# Patient Record
Sex: Female | Born: 1990 | Race: Black or African American | Hispanic: No | Marital: Single | Smoking: Former smoker
Health system: Southern US, Community
[De-identification: ages and names within clinical notes are randomized; demographics above are authoritative.]

## PROBLEM LIST (undated history)

## (undated) ENCOUNTER — Inpatient Hospital Stay (HOSPITAL_COMMUNITY): Payer: Self-pay

## (undated) DIAGNOSIS — J40 Bronchitis, not specified as acute or chronic: Secondary | ICD-10-CM

## (undated) DIAGNOSIS — O139 Gestational [pregnancy-induced] hypertension without significant proteinuria, unspecified trimester: Secondary | ICD-10-CM

## (undated) DIAGNOSIS — R519 Headache, unspecified: Secondary | ICD-10-CM

## (undated) DIAGNOSIS — Q0700 Arnold-Chiari syndrome without spina bifida or hydrocephalus: Secondary | ICD-10-CM

## (undated) DIAGNOSIS — J45909 Unspecified asthma, uncomplicated: Secondary | ICD-10-CM

## (undated) DIAGNOSIS — R51 Headache: Secondary | ICD-10-CM

## (undated) HISTORY — PX: NOSE SURGERY: SHX723

## (undated) HISTORY — DX: Arnold-Chiari syndrome without spina bifida or hydrocephalus: Q07.00

---

## 2012-01-21 ENCOUNTER — Emergency Department (HOSPITAL_COMMUNITY)
Admission: EM | Admit: 2012-01-21 | Discharge: 2012-01-21 | Disposition: A | Discharge status: Home / Self Care | Payer: Medicaid - Out of State | Attending: Emergency Medicine | Admitting: Emergency Medicine

## 2012-01-21 ENCOUNTER — Emergency Department (HOSPITAL_COMMUNITY): Payer: Medicaid - Out of State

## 2012-01-21 ENCOUNTER — Encounter (HOSPITAL_COMMUNITY): Payer: Self-pay | Admitting: *Deleted

## 2012-01-21 DIAGNOSIS — J45901 Unspecified asthma with (acute) exacerbation: Secondary | ICD-10-CM | POA: Insufficient documentation

## 2012-01-21 DIAGNOSIS — R059 Cough, unspecified: Secondary | ICD-10-CM | POA: Insufficient documentation

## 2012-01-21 DIAGNOSIS — R05 Cough: Secondary | ICD-10-CM | POA: Insufficient documentation

## 2012-01-21 DIAGNOSIS — O9989 Other specified diseases and conditions complicating pregnancy, childbirth and the puerperium: Secondary | ICD-10-CM | POA: Insufficient documentation

## 2012-01-21 DIAGNOSIS — J3489 Other specified disorders of nose and nasal sinuses: Secondary | ICD-10-CM | POA: Insufficient documentation

## 2012-01-21 DIAGNOSIS — Z79899 Other long term (current) drug therapy: Secondary | ICD-10-CM | POA: Insufficient documentation

## 2012-01-21 HISTORY — DX: Bronchitis, not specified as acute or chronic: J40

## 2012-01-21 HISTORY — DX: Unspecified asthma, uncomplicated: J45.909

## 2012-01-21 MED ORDER — ALBUTEROL SULFATE (5 MG/ML) 0.5% IN NEBU
5.0000 mg | INHALATION_SOLUTION | Freq: Once | RESPIRATORY_TRACT | Status: AC
  Administered 2012-01-21: 5 mg via RESPIRATORY_TRACT
  Filled 2012-01-21: qty 1

## 2012-01-21 MED ORDER — IPRATROPIUM BROMIDE 0.02 % IN SOLN
0.5000 mg | Freq: Once | RESPIRATORY_TRACT | Status: AC
  Administered 2012-01-21: 0.5 mg via RESPIRATORY_TRACT
  Filled 2012-01-21: qty 2.5

## 2012-01-21 MED ORDER — ALBUTEROL SULFATE HFA 108 (90 BASE) MCG/ACT IN AERS: 1.0000 | INHALATION_SPRAY | Freq: Four times a day (QID) | RESPIRATORY_TRACT | Status: DC | PRN

## 2012-01-21 MED ORDER — PREDNISONE 20 MG PO TABS
60.0000 mg | ORAL_TABLET | Freq: Once | ORAL | Status: AC
  Administered 2012-01-21: 60 mg via ORAL
  Filled 2012-01-21: qty 3

## 2012-01-21 MED ORDER — ALBUTEROL (5 MG/ML) CONTINUOUS INHALATION SOLN
10.0000 mg/h | INHALATION_SOLUTION | Freq: Once | RESPIRATORY_TRACT | Status: AC
  Administered 2012-01-21: 10 mg/h via RESPIRATORY_TRACT
  Filled 2012-01-21 (×2): qty 20

## 2012-01-21 MED ORDER — PREDNISONE 50 MG PO TABS: 50.0000 mg | ORAL_TABLET | Freq: Every day | ORAL | Status: DC

## 2012-01-21 NOTE — ED Provider Notes (Signed)
History     CSN: [id number]  Arrival date & time 01/21/12  4098   First MD Initiated Contact with Patient 01/21/12 [phone]      Chief Complaint  Patient presents with  . Shortness of Breath  . pregnant     (Consider location/radiation/quality/duration/timing/severity/associated sxs/prior treatment) HPI Pt with history of childhood asthma and exacerbation several weeks ago but did not get inh rx filled, presents with wheezing, SOB and productive cough starting yesterday and progressing through the night. No fever, chills. No Cp, abd pain, N/V/D, vag bleeding or d/c. Pt is [redacted] wk pregnant.  Past Medical History  Diagnosis Date  . Asthma   . Bronchitis     Past Surgical History  Procedure Date  . Nose surgery   . Cesarean section     History reviewed. No pertinent family history.  History  Substance Use Topics  . Smoking status: Never Smoker   . Smokeless tobacco: Not on file  . Alcohol Use: No    OB History    Grav Para Term Preterm Abortions TAB SAB Ect Mult Living   1               Review of Systems  Constitutional: Negative for fever and chills.  HENT: Positive for congestion. Negative for sore throat and sinus pressure.   Respiratory: Positive for cough, shortness of breath and wheezing. Negative for chest tightness.   Cardiovascular: Negative for chest pain, palpitations and leg swelling.  Gastrointestinal: Negative for nausea, vomiting, abdominal pain and constipation.  Genitourinary: Negative for dysuria, vaginal bleeding, vaginal discharge and pelvic pain.  Musculoskeletal: Negative for myalgias and back pain.  Skin: Negative for rash and wound.  Neurological: Negative for dizziness, weakness, light-headedness, numbness and headaches.  All other systems reviewed and are negative.    Allergies  Penicillins  Home Medications   Current Outpatient Rx  Name  Route  Sig  Dispense  Refill  . ALBUTEROL SULFATE HFA 108 (90 BASE) MCG/ACT IN AERS  Inhalation   Inhale 1-2 puffs into the lungs every 6 (six) hours as needed for wheezing.   1 Inhaler   0   . PREDNISONE 50 MG PO TABS   Oral   Take 1 tablet (50 mg total) by mouth daily.   5 tablet   0     BP 129/66  Pulse 102  Temp 98 F (36.7 C) (Oral)  Resp 20  SpO2 100%  Physical Exam  Nursing note and vitals reviewed. Constitutional: She is oriented to person, place, and time. She appears well-developed and well-nourished. No distress.  HENT:  Head: Normocephalic and atraumatic.  Mouth/Throat: Oropharynx is clear and moist. No oropharyngeal exudate.  Eyes: EOM are normal. Pupils are equal, round, and reactive to light.  Neck: Normal range of motion. Neck supple.  Cardiovascular: Normal rate and regular rhythm.   Pulmonary/Chest: Effort normal. No respiratory distress. She has wheezes (Exp wheezes throughout). She has no rales.  Abdominal: Soft. Bowel sounds are normal. She exhibits no distension and no mass. There is no tenderness. There is no rebound and no guarding.  Musculoskeletal: Normal range of motion. She exhibits no edema and no tenderness.       No calf swelling or tenderness  Neurological: She is alert and oriented to person, place, and time.  Skin: Skin is warm and dry. No rash noted. No erythema.  Psychiatric: She has a normal mood and affect. Her behavior is normal.    ED Course  Procedures (  including critical care time)  Labs Reviewed - No data to display Dg Chest 1 View  01/21/2012  *RADIOLOGY REPORT*  Clinical Data: Shortness of breath; history of asthma.  CHEST - 1 VIEW  Comparison: None.  Findings: The lungs are well-aerated and clear.  There is no evidence of focal opacification, pleural effusion or pneumothorax.  The cardiomediastinal silhouette is within normal limits.  No acute osseous abnormalities are seen.  IMPRESSION: No acute cardiopulmonary process seen.   Original Report Authenticated By: Tonia Ghent, M.D.      1. Asthma  exacerbation       MDM          Loren Racer, MD 01/22/12 1228

## 2012-01-21 NOTE — ED Notes (Signed)
Pt states [redacted] weeks pregnant and feels like she cannot breath. Pt ambulatory and denies pain. Pt states it started tonight and she tired hot shower and it didn't help. Pt not in respiratory distress.

## 2012-01-21 NOTE — ED Provider Notes (Signed)
Care assumed at sign out. Patient hx of asthma, [redacted] wk pregnant, here with wheezing. CXR nl, after nebs and steroids, felt better. Minimal wheezing now, pulse ox 100% on RA. Will d/c home on a course of prednisone, and albuterol prn.   Richardean Canal, MD 01/21/12 [phone]

## 2013-11-28 ENCOUNTER — Encounter (HOSPITAL_COMMUNITY): Payer: Self-pay | Admitting: *Deleted

## 2015-02-14 ENCOUNTER — Encounter (HOSPITAL_COMMUNITY): Payer: Self-pay | Admitting: Emergency Medicine

## 2015-02-14 ENCOUNTER — Emergency Department (HOSPITAL_COMMUNITY)
Admission: EM | Admit: 2015-02-14 | Discharge: 2015-02-14 | Disposition: A | Discharge status: Home / Self Care | Payer: Medicaid Other | Attending: Emergency Medicine | Admitting: Emergency Medicine

## 2015-02-14 DIAGNOSIS — H6092 Unspecified otitis externa, left ear: Secondary | ICD-10-CM | POA: Diagnosis not present

## 2015-02-14 DIAGNOSIS — J45909 Unspecified asthma, uncomplicated: Secondary | ICD-10-CM | POA: Insufficient documentation

## 2015-02-14 DIAGNOSIS — Z79899 Other long term (current) drug therapy: Secondary | ICD-10-CM | POA: Diagnosis not present

## 2015-02-14 DIAGNOSIS — Z880 Allergy status to penicillin: Secondary | ICD-10-CM | POA: Diagnosis not present

## 2015-02-14 DIAGNOSIS — H9202 Otalgia, left ear: Secondary | ICD-10-CM | POA: Diagnosis present

## 2015-02-14 DIAGNOSIS — Z7952 Long term (current) use of systemic steroids: Secondary | ICD-10-CM | POA: Diagnosis not present

## 2015-02-14 DIAGNOSIS — H9190 Unspecified hearing loss, unspecified ear: Secondary | ICD-10-CM | POA: Diagnosis not present

## 2015-02-14 MED ORDER — CEFDINIR 300 MG PO CAPS: 300.0000 mg | ORAL_CAPSULE | Freq: Two times a day (BID) | ORAL | Status: DC

## 2015-02-14 MED ORDER — CIPROFLOXACIN-DEXAMETHASONE 0.3-0.1 % OT SUSP: 4.0000 [drp] | Freq: Two times a day (BID) | OTIC | Status: DC

## 2015-02-14 NOTE — Discharge Instructions (Signed)
Use ear drops and take antibiotic as directed for your ear infection. Avoid being underwater until this heals, do not submerge your head in pools or bath tubs, etc. Use tylenol or motrin as needed for pain. Use warm compresses as needed for additional relief. Follow up with the ENT doctor listed above for ongoing management of your ear infection in 1 week. Return to the ER for changes or worsening symptoms.   Otitis Externa Otitis externa is a germ infection in the outer ear. The outer ear is the area from the eardrum to the outside of the ear. Otitis externa is sometimes called "swimmer's ear." HOME CARE  Put drops in the ear as told by your doctor.  Only take medicine as told by your doctor.  If you have diabetes, your doctor may give you more directions. Follow your doctor's directions.  Keep all doctor visits as told. To avoid another infection:  Keep your ear dry. Use the corner of a towel to dry your ear after swimming or bathing.  Avoid scratching or putting things inside your ear.  Avoid swimming in lakes, dirty water, or pools that use a chemical called chlorine poorly.  You may use ear drops after swimming. Combine equal amounts of white vinegar and alcohol in a bottle. Put 3 or 4 drops in each ear. GET HELP IF:   You have a fever.  Your ear is still red, puffy (swollen), or painful after 3 days.  You still have yellowish-white fluid (pus) coming from the ear after 3 days.  Your redness, puffiness, or pain gets worse.  You have a really bad headache.  You have redness, puffiness, pain, or tenderness behind your ear. MAKE SURE YOU:   Understand these instructions.  Will watch your condition.  Will get help right away if you are not doing well or get worse.   This information is not intended to replace advice given to you by your health care provider. Make sure you discuss any questions you have with your health care provider.   Document Released: 07/02/2007  Document Revised: 02/03/2014 Document Reviewed: 01/30/2011 Elsevier Interactive Patient Education 2016 [employer]  Ear Drops, Adult You need to put eardrops in your ear. HOME CARE   Put drops in your affected ear as told.  After putting in the drops, lie down with the ear you put the drops in facing up. Stay this way for 10 minutes. Use the ear drops as long as your doctor tells you.  Before you get up, put a cotton ball gently in your ear. Do not push it far in your ear.  Do not wash out your ears unless your doctor says it is okay.  Finish all medicines as told by your doctor. You may be told to keep using the eardrops even if you start to feel better.  See your doctor as told for follow-up visits. GET HELP IF:  You have pain that gets worse.  Any unusual fluid (drainage) is coming from your ear (especially if the fluid stinks).  You have trouble hearing.  You get really dizzy as if the room is spinning and feel sick to your stomach (vertigo).  The outside of your ear becomes red or puffy or both. This may be a sign of an allergic reaction. MAKE SURE YOU:   Understand these instructions.  Will watch your condition.  Will get help right away if you are not doing well or get worse.   This information is not intended  to replace advice given to you by your health care provider. Make sure you discuss any questions you have with your health care provider.   Document Released: 07/03/2009 Document Revised: 02/03/2014 Document Reviewed: 08/10/2012 Elsevier Interactive Patient Education 2016 [employer].  Earache An earache, also called otalgia, can be caused by many things. Pain from an earache can be sharp, dull, or burning. The pain may be temporary or constant. Earaches can be caused by problems with the ear, such as infection in either the middle ear or the ear canal, injury, impacted ear wax, middle ear pressure, or a foreign body in the ear. Ear pain can also result  from problems in other areas. This is called referred pain. For example, pain can come from a sore throat, a tooth infection, or problems with the jaw or the joint between the jaw and the skull (temporomandibular joint, or TMJ). The cause of an earache is not always easy to identify. Watchful waiting may be appropriate for some earaches until a clear cause of the pain can be found. HOME CARE INSTRUCTIONS Watch your condition for any changes. The following actions may help to lessen any discomfort that you are feeling:  Take medicines only as directed by your health care provider. This includes ear drops.  Apply ice to your outer ear to help reduce pain.  Put ice in a plastic bag.  Place a towel between your skin and the bag.  Leave the ice on for 20 minutes, 2-3 times per day.  Do not put anything in your ear other than medicine that is prescribed by your health care provider.  Try resting in an upright position instead of lying down. This may help to reduce pressure in the middle ear and relieve pain.  Chew gum if it helps to relieve your ear pain.  Control any allergies that you have.  Keep all follow-up visits as directed by your health care provider. This is important. SEEK MEDICAL CARE IF:  Your pain does not improve within 2 days.  You have a fever.  You have new or worsening symptoms. SEEK IMMEDIATE MEDICAL CARE IF:  You have a severe headache.  You have a stiff neck.  You have difficulty swallowing.  You have redness or swelling behind your ear.  You have drainage from your ear.  You have hearing loss.  You feel dizzy.   This information is not intended to replace advice given to you by your health care provider. Make sure you discuss any questions you have with your health care provider.   Document Released: 08/31/2003 Document Revised: 02/03/2014 Document Reviewed: 08/14/2013 Elsevier Interactive Patient Education [employer]

## 2015-02-14 NOTE — ED Provider Notes (Signed)
CSN: [id number]     Arrival date & time 02/14/15  2041 History  By signing my name below, I, Evon Slack, attest that this documentation has been prepared under the direction and in the presence of Rasaan Brotherton Camprubi-Soms, PA-C. Electronically Signed: Evon Slack, ED Scribe. 02/14/2015. 9:16 PM.      Chief Complaint  Patient presents with  . Otalgia    Patient is a 25 y.o. female presenting with ear pain. The history is provided by the patient. No language interpreter was used.  Otalgia Location:  Left Behind ear:  No abnormality Quality:  Sore and burning Severity:  Moderate Onset quality:  Gradual Duration:  2 months Timing:  Constant Progression:  Unchanged Chronicity:  New Context: not elevation change and no water in ear   Relieved by:  Nothing Worsened by:  Palpation Ineffective treatments: cortisporin ear drops. Associated symptoms: ear discharge and hearing loss   Associated symptoms: no abdominal pain, no cough, no diarrhea, no fever, no rhinorrhea, no sore throat and no vomiting   Risk factors: no recent travel    HPI Comments: Meagan Ross is a 25 y.o. female with a PMHx of asthma, who presents to the Emergency Department complaining of left ear pain onset 2 months prior. She describes the pain as an intermittent soreness that she rates 8/10, stating it's nonradiating, worse with palpation, and unimproved with cortisporin ear drops prescribed by her PCP several weeks ago. Pt reports an intermittent burning-itching sensation as well. She reports associated decreased hearing and white discharge, stating the white discharge improved after using the drops, but the pain persisted. She denies any recent travel. She denies swimming recently. Pt denies any recent sick contacts.  Denies fever, chills, ongoing ear drainage, rhinorrhea, sore throat, eye itching/redness, CP, SOB, cough, abdominal pain, n/v/d/c, dysuria, hematuria, numbness, tingling, or weakness. Has not yet  seen an ENT. Has no other medical conditions, including DM or HIV.   Past Medical History  Diagnosis Date  . Asthma   . Bronchitis    Past Surgical History  Procedure Laterality Date  . Nose surgery    . Cesarean section     No family history on file. Social History  Substance Use Topics  . Smoking status: Never Smoker   . Smokeless tobacco: Not on file  . Alcohol Use: No   OB History    Gravida Para Term Preterm AB TAB SAB Ectopic Multiple Living   1              Review of Systems  Constitutional: Negative for fever and chills.  HENT: Positive for ear discharge, ear pain and hearing loss. Negative for rhinorrhea and sore throat.   Eyes: Negative for redness and itching.  Respiratory: Negative for cough and shortness of breath.   Cardiovascular: Negative for chest pain.  Gastrointestinal: Negative for nausea, vomiting, abdominal pain, diarrhea and constipation.  Genitourinary: Negative for dysuria and hematuria.  Musculoskeletal: Negative for myalgias and arthralgias.  Skin: Negative for color change.  Allergic/Immunologic: Negative for immunocompromised state.  Neurological: Negative for weakness and numbness.   A complete 10 system review of systems was obtained and all systems are negative except as noted in the HPI and PMH.     Allergies  Penicillins  Home Medications   Prior to Admission medications   Medication Sig Start Date End Date Taking? Authorizing Provider  albuterol (PROVENTIL HFA;VENTOLIN HFA) 108 (90 BASE) MCG/ACT inhaler Inhale 1-2 puffs into the lungs every 6 (six) hours as needed  for wheezing. 01/21/12   Loren Racer, MD  cefdinir (OMNICEF) 300 MG capsule Take 1 capsule (300 mg total) by mouth 2 (two) times daily. x10 days 02/14/15   Brexlee Heberlein Camprubi-Soms, PA-C  ciprofloxacin-dexamethasone (CIPRODEX) otic suspension Place 4 drops into the left ear 2 (two) times daily. x7 days 02/14/15   Shlomo Seres Camprubi-Soms, PA-C  predniSONE (DELTASONE) 50 MG  tablet Take 1 tablet (50 mg total) by mouth daily. 01/21/12   Loren Racer, MD   BP 118/54 mmHg  Pulse 71  Temp(Src) 98.1 F (36.7 C) (Oral)  Resp 16  Ht  (1.626 m)  Wt 83.915 kg  BMI 31.74 kg/m2  SpO2 98%   Physical Exam  Constitutional: She is oriented to person, place, and time. Vital signs are normal. She appears well-developed and well-nourished.  Non-toxic appearance. No distress.  Afebrile, nontoxic, NAD  HENT:  Head: Normocephalic and atraumatic.  Right Ear: Hearing, tympanic membrane, external ear and ear canal normal.  Left Ear: There is drainage and swelling. No tenderness. No mastoid tenderness.  Mouth/Throat: Mucous membranes are normal.  Right ear clear. Left ear with mucoid drainage and swelling in the external ear canal which obscures visualization of the TM, no pain with pinna retraction, no mastoid tenderness or erythema. Unable to see TM to evaluate for perforation or other findings.  Nose clear. Oropharynx clear and moist, without uvular swelling or deviation, no trismus or drooling, no tonsillar swelling or erythema, no exudates.   Eyes: Conjunctivae and EOM are normal. Right eye exhibits no discharge. Left eye exhibits no discharge.  Neck: Normal range of motion. Neck supple.  Cardiovascular: Normal rate.   Pulmonary/Chest: Effort normal. No respiratory distress.  Abdominal: Normal appearance. She exhibits no distension.  Musculoskeletal: Normal range of motion.  Neurological: She is alert and oriented to person, place, and time. She has normal strength. No sensory deficit.  Skin: Skin is warm, dry and intact. No rash noted.  Psychiatric: She has a normal mood and affect. Her behavior is normal.  Nursing note and vitals reviewed.   ED Course  Procedures (including critical care time) DIAGNOSTIC STUDIES: COORDINATION OF CARE: 9:17 PM-Discussed treatment plan with pt at bedside and pt agreed to plan.     Labs Review Labs Reviewed - No data to  display  Imaging Review No results found.    EKG Interpretation None      MDM   Final diagnoses:  Otitis externa, left  Otalgia of left ear    25 y.o. female here with 2 months of ongoing L otalgia. Was given cortisporin without significant improvement. Endorses hearing loss. On exam, mucoid drainage and swelling in canal, obscuring visualization of TM. Unable to see if TM is perforated or not. Will use ciprodex to treat for likely otitis externa, but also treat for possible otitis media using cefdinir since I can't visualize the TM and can't determine if AOM is also present. Plus, if TM is ruptured, this will adequately cover for this condition. F/up with ENT in 1wk for ongoing management, since this seems to be a persistent issue. Tylenol/motrin for pain. I explained the diagnosis and have given explicit precautions to return to the ER including for any other new or worsening symptoms. The patient understands and accepts the medical plan as it's been dictated and I have answered their questions. Discharge instructions concerning home care and prescriptions have been given. The patient is STABLE and is discharged to home in good condition.   I personally performed the  services described in this documentation, which was scribed in my presence. The recorded information has been reviewed and is accurate.  BP 118/54 mmHg  Pulse 71  Temp(Src) 98.1 F (36.7 C) (Oral)  Resp 16  Ht  (1.626 m)  Wt 83.915 kg  BMI 31.74 kg/m2  SpO2 98%  Meds ordered this encounter  Medications  . ciprofloxacin-dexamethasone (CIPRODEX) otic suspension    Sig: Place 4 drops into the left ear 2 (two) times daily. x7 days    Dispense:  7.5 mL    Refill:  0    Order Specific Question:  Supervising Provider    Answer:  MILLER, BRIAN [[id number]]  . cefdinir (OMNICEF) 300 MG capsule    Sig: Take 1 capsule (300 mg total) by mouth 2 (two) times daily. x10 days    Dispense:  20 capsule    Refill:  0    Order  Specific Question:  Supervising Provider    Answer:  Eber Hong [[id number]]        Ocie Tino Camprubi-Soms, PA-C 02/14/15 2131  Doug Sou, MD 02/14/15 2342

## 2015-02-14 NOTE — ED Notes (Signed)
Pt st's she has had a left earache for approx 2 months

## 2015-02-22 ENCOUNTER — Encounter (HOSPITAL_COMMUNITY): Payer: Self-pay | Admitting: Emergency Medicine

## 2015-02-22 ENCOUNTER — Emergency Department (HOSPITAL_COMMUNITY)
Admission: EM | Admit: 2015-02-22 | Discharge: 2015-02-22 | Disposition: A | Discharge status: Home / Self Care | Payer: Medicaid Other | Attending: Emergency Medicine | Admitting: Emergency Medicine

## 2015-02-22 DIAGNOSIS — S3992XA Unspecified injury of lower back, initial encounter: Secondary | ICD-10-CM | POA: Insufficient documentation

## 2015-02-22 DIAGNOSIS — J45909 Unspecified asthma, uncomplicated: Secondary | ICD-10-CM | POA: Insufficient documentation

## 2015-02-22 DIAGNOSIS — Z880 Allergy status to penicillin: Secondary | ICD-10-CM | POA: Diagnosis not present

## 2015-02-22 DIAGNOSIS — Y998 Other external cause status: Secondary | ICD-10-CM | POA: Diagnosis not present

## 2015-02-22 DIAGNOSIS — Y9389 Activity, other specified: Secondary | ICD-10-CM | POA: Insufficient documentation

## 2015-02-22 DIAGNOSIS — Y92410 Unspecified street and highway as the place of occurrence of the external cause: Secondary | ICD-10-CM | POA: Diagnosis not present

## 2015-02-22 DIAGNOSIS — S3991XA Unspecified injury of abdomen, initial encounter: Secondary | ICD-10-CM | POA: Diagnosis present

## 2015-02-22 DIAGNOSIS — Z7952 Long term (current) use of systemic steroids: Secondary | ICD-10-CM | POA: Insufficient documentation

## 2015-02-22 DIAGNOSIS — Z79899 Other long term (current) drug therapy: Secondary | ICD-10-CM | POA: Diagnosis not present

## 2015-02-22 MED ORDER — METHOCARBAMOL 500 MG PO TABS: 500.0000 mg | ORAL_TABLET | Freq: Two times a day (BID) | ORAL | Status: DC

## 2015-02-22 MED ORDER — IBUPROFEN 800 MG PO TABS: 800.0000 mg | ORAL_TABLET | Freq: Three times a day (TID) | ORAL | Status: DC
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
## 2015-02-22 NOTE — Discharge Instructions (Signed)

## 2015-05-05 ENCOUNTER — Emergency Department (HOSPITAL_COMMUNITY)
Admission: EM | Admit: 2015-05-05 | Discharge: 2015-05-05 | Disposition: A | Discharge status: Home / Self Care | Payer: Medicaid Other | Attending: Emergency Medicine | Admitting: Emergency Medicine

## 2015-05-05 ENCOUNTER — Encounter (HOSPITAL_COMMUNITY): Payer: Self-pay | Admitting: Emergency Medicine

## 2015-05-05 DIAGNOSIS — Z791 Long term (current) use of non-steroidal anti-inflammatories (NSAID): Secondary | ICD-10-CM | POA: Insufficient documentation

## 2015-05-05 DIAGNOSIS — J45909 Unspecified asthma, uncomplicated: Secondary | ICD-10-CM | POA: Diagnosis not present

## 2015-05-05 DIAGNOSIS — Z79899 Other long term (current) drug therapy: Secondary | ICD-10-CM | POA: Insufficient documentation

## 2015-05-05 DIAGNOSIS — R519 Headache, unspecified: Secondary | ICD-10-CM

## 2015-05-05 DIAGNOSIS — H53149 Visual discomfort, unspecified: Secondary | ICD-10-CM | POA: Insufficient documentation

## 2015-05-05 DIAGNOSIS — K0381 Cracked tooth: Secondary | ICD-10-CM | POA: Insufficient documentation

## 2015-05-05 DIAGNOSIS — Z7952 Long term (current) use of systemic steroids: Secondary | ICD-10-CM | POA: Insufficient documentation

## 2015-05-05 DIAGNOSIS — Z880 Allergy status to penicillin: Secondary | ICD-10-CM | POA: Diagnosis not present

## 2015-05-05 DIAGNOSIS — R51 Headache: Secondary | ICD-10-CM | POA: Insufficient documentation

## 2015-05-05 HISTORY — DX: Headache: R51

## 2015-05-05 HISTORY — DX: Headache, unspecified: R51.9

## 2015-05-05 MED ORDER — HYDROCODONE-ACETAMINOPHEN 5-325 MG PO TABS
1.0000 | ORAL_TABLET | Freq: Once | ORAL | Status: AC
Start: 2015-05-05 — End: 2015-05-05
  Administered 2015-05-05: 1 via ORAL
  Filled 2015-05-05: qty 1

## 2015-05-05 MED ORDER — TRAMADOL HCL 50 MG PO TABS
50.0000 mg | ORAL_TABLET | Freq: Two times a day (BID) | ORAL | Status: DC | PRN
Start: 2015-05-05 — End: 2017-03-25

## 2015-05-05 MED ORDER — KETOROLAC TROMETHAMINE 60 MG/2ML IM SOLN
60.0000 mg | Freq: Once | INTRAMUSCULAR | Status: AC
  Administered 2015-05-05: 60 mg via INTRAMUSCULAR
  Filled 2015-05-05: qty 2

## 2015-05-05 MED ORDER — METOCLOPRAMIDE HCL 10 MG PO TABS
10.0000 mg | ORAL_TABLET | Freq: Once | ORAL | Status: AC
  Administered 2015-05-05: 10 mg via ORAL
  Filled 2015-05-05: qty 1

## 2015-05-05 MED ORDER — DIPHENHYDRAMINE HCL 25 MG PO CAPS
50.0000 mg | ORAL_CAPSULE | Freq: Once | ORAL | Status: AC
  Administered 2015-05-05: 50 mg via ORAL
  Filled 2015-05-05: qty 2

## 2015-05-05 NOTE — ED Notes (Signed)
Patient presented to the ED with c/o headache.  States she has never had a headache like this but thinks it may be a bad tooth on the top in the back.  States the toothache comes and goes but has never been this bad.  Has taken Tylenol PTA with no relief.  Patient tearful at this time

## 2015-05-05 NOTE — Discharge Instructions (Signed)
General Headache Meagan Ross, you need to see the dentist within 3 days for evaluation of your tooth, this may be causing your headache.  Continue to take tylenol  every hours, or ibuprofen  every 8 hours for your pain.  If any symptoms worsen, come back to the ED immediately. Thank you. A headache is pain or discomfort felt around the head or neck area. There are many causes and types of headaches. In some cases, the cause may not be found.  HOME CARE  Managing Pain  Take over-the-counter and prescription medicines only as told by your doctor.  Lie down in a dark, quiet room when you have a headache.  If directed, apply ice to the head and neck area:  Put ice in a plastic bag.  Place a towel between your skin and the bag.  Leave the ice on for 20 minutes, 2-3 times per day.  Use a heating pad or hot shower to apply heat to the head and neck area as told by your doctor.  Keep lights dim if bright lights bother you or make your headaches worse. Eating and Drinking  Eat meals on a regular schedule.  Lessen how much alcohol you drink.  Lessen how much caffeine you drink, or stop drinking caffeine. General Instructions  Keep all follow-up visits as told by your doctor. This is important.  Keep a journal to find out if certain things bring on headaches. For example, write down:  What you eat and drink.  How much sleep you get.  Any change to your diet or medicines.  Relax by getting a massage or doing other relaxing activities.  Lessen stress.  Sit up straight. Do not tighten (tense) your muscles.  Do not use tobacco products. This includes cigarettes, chewing tobacco, or e-cigarettes. If you need help quitting, ask your doctor.  Exercise regularly as told by your doctor.  Get enough sleep. This often means 7-9 hours of sleep. GET HELP IF:  Your symptoms are not helped by medicine.  You have a headache that feels different than the other headaches.  You  feel sick to your stomach (nauseous) or you throw up (vomit).  You have a fever. GET HELP RIGHT AWAY IF:   Your headache becomes really bad.  You keep throwing up.  You have a stiff neck.  You have trouble seeing.  You have trouble speaking.  You have pain in the eye or ear.  Your muscles are weak or you lose muscle control.  You lose your balance or have trouble walking.  You feel like you will pass out (faint) or you pass out.  You have confusion.   This information is not intended to replace advice given to you by your health care provider. Make sure you discuss any questions you have with your health care provider.   Document Released: 10/23/2007 Document Revised: 10/04/2014 Document Reviewed: 05/08/2014 Elsevier Interactive Patient Education 2016 Elsevier Inc. Dental Pain Dental pain may be caused by many things, including:  Tooth decay (cavities or caries). Cavities cause the nerve of your tooth to be open to air and hot or cold temperatures. This can cause pain or discomfort.  Abscess or infection. A dental abscess is an area that is full of infected pus from a bacterial infection in the inner part of the tooth (pulp). It usually happens at the end of the tooth's root.  Injury.  An unknown reason (idiopathic). Your pain may be mild or severe. It may only happen  when:  You are chewing.  You are exposed to hot or cold temperature.  You are eating or drinking sugary foods or beverages, such as:  Soda.  Candy. Your pain may also be there all of the time. HOME CARE Watch your dental pain for any changes. Do these things to lessen your discomfort:  Take medicines only as told by your dentist.  If your dentist tells you to take an antibiotic medicine, finish all of it even if you start to feel better.  Keep all follow-up visits as told by your dentist. This is important.  Do not apply heat to the outside of your face.  Rinse your mouth or gargle with  salt water if told by your dentist. This helps with pain and swelling.  You can make salt water by adding  tsp of salt to 1 cup of warm water.  Apply ice to the painful area of your face:  Put ice in a plastic bag.  Place a towel between your skin and the bag.  Leave the ice on for 20 minutes, 2-3 times per day.  Avoid foods or drinks that cause you pain, such as:  Very hot or very cold foods or drinks.  Sweet or sugary foods or drinks. GET HELP IF:  Your pain is not helped with medicines.  Your symptoms are worse.  You have new symptoms. GET HELP RIGHT AWAY IF:  You cannot open your mouth.  You are having trouble breathing or swallowing.  You have a fever.  Your face, neck, or jaw is puffy (swollen).   This information is not intended to replace advice given to you by your health care provider. Make sure you discuss any questions you have with your health care provider.   Document Released: 07/02/2007 Document Revised: 05/30/2014 Document Reviewed: 01/09/2014 Elsevier Interactive Patient Education [employer]2016 Elsevier Inc.

## 2015-05-05 NOTE — ED Notes (Addendum)
Pt went to bed at 9pm and woke up @ 1am with intermittent headache and light sensitivity.  Denies nausea and vomiting.  No facial droop, speech clear, and no arm drift.

## 2015-05-05 NOTE — ED Provider Notes (Signed)
CSN: [id number]     Arrival date & time 05/05/15  [id number] History  By signing my name below, I, Arianna Nassar, attest that this documentation has been prepared under the direction and in the presence of Tomasita CrumbleAdeleke Taylore Hinde, MD. Electronically Signed: Octavia HeirArianna Nassar, ED Scribe. 05/05/2015. 3:40 AM.    Chief Complaint  Patient presents with  . Headache      The history is provided by the patient. No language interpreter was used.   HPI Comments: Meagan Ross is a 25 y.o. female who has a PMHx of presents to the Emergency Department complaining of constant, gradual worsening, moderate headache onset 4 hours ago. Pt says she went to bed around 9 pm and woke up about 4 hours ago with a severe headache and light-sensitivity. She says she has and a headache similar to this in the past that was caused by a dental problem. Pt says that her face feels paralyzed and she cannot think straight. She has taken ibuprofen to alleviate her pain with no relief. She notes she has been sick recently. Denies neck stiffness, neck tightness, cough, fever, chills.   Past Medical History  Diagnosis Date  . Asthma   . Bronchitis   . Headache    Past Surgical History  Procedure Laterality Date  . Nose surgery    . Cesarean section     No family history on file. Social History  Substance Use Topics  . Smoking status: Never Smoker   . Smokeless tobacco: None  . Alcohol Use: No   OB History    Gravida Para Term Preterm AB TAB SAB Ectopic Multiple Living   1              Review of Systems  A complete 10 system review of systems was obtained and all systems are negative except as noted in the HPI and PMH.    Allergies  Penicillins  Home Medications   Prior to Admission medications   Medication Sig Start Date End Date Taking? Authorizing Provider  albuterol (PROVENTIL HFA;VENTOLIN HFA) 108 (90 BASE) MCG/ACT inhaler Inhale 1-2 puffs into the lungs every 6 (six) hours as needed for wheezing. 01/21/12   Loren Raceravid  Yelverton, MD  cefdinir (OMNICEF) 300 MG capsule Take 1 capsule (300 mg total) by mouth 2 (two) times daily. x10 days 02/14/15   Mercedes Camprubi-Soms, PA-C  ciprofloxacin-dexamethasone (CIPRODEX) otic suspension Place 4 drops into the left ear 2 (two) times daily. x7 days 02/14/15   Mercedes Camprubi-Soms, PA-C  ibuprofen (ADVIL,MOTRIN) 800 MG tablet Take 1 tablet (800 mg total) by mouth 3 (three) times daily. 02/22/15   Fayrene HelperBowie Tran, PA-C  methocarbamol (ROBAXIN) 500 MG tablet Take 1 tablet (500 mg total) by mouth 2 (two) times daily. 02/22/15   Fayrene HelperBowie Tran, PA-C  predniSONE (DELTASONE) 50 MG tablet Take 1 tablet (50 mg total) by mouth daily. 01/21/12   Loren Raceravid Yelverton, MD   Triage vitals: BP 118/80 mmHg  Pulse 69  Temp(Src) 98.1 F (36.7 C) (Oral)  Resp 16  SpO2 98% Physical Exam  Constitutional: She is oriented to person, place, and time. She appears well-developed and well-nourished. No distress.  HENT:  Head: Normocephalic and atraumatic.  Nose: Nose normal.  Mouth/Throat: Oropharynx is clear and moist. No oropharyngeal exudate.  Broken tooth #31 with no swelling or abscess formation around it  Eyes: Conjunctivae and EOM are normal. Pupils are equal, round, and reactive to light. No scleral icterus.  Neck: Normal range of motion. Neck supple. No JVD  present. No tracheal deviation present. No thyromegaly present.  Cardiovascular: Normal rate, regular rhythm and normal heart sounds.  Exam reveals no gallop and no friction rub.   No murmur heard. Pulmonary/Chest: Effort normal and breath sounds normal. No respiratory distress. She has no wheezes. She exhibits no tenderness.  Abdominal: Soft. Bowel sounds are normal. She exhibits no distension and no mass. There is no tenderness. There is no rebound and no guarding.  Musculoskeletal: Normal range of motion. She exhibits no edema or tenderness.  Lymphadenopathy:    She has no cervical adenopathy.  Neurological: She is alert and oriented to  person, place, and time. No cranial nerve deficit. She exhibits normal muscle tone.  Normal strength and sensation, normal cerebellar testing  Skin: Skin is warm and dry. No rash noted. No erythema. No pallor.  Nursing note and vitals reviewed.   ED Course  Procedures  DIAGNOSTIC STUDIES: Oxygen Saturation is 98% on RA, normal by my interpretation.  COORDINATION OF CARE:  3:34 AM Discussed treatment plan with pt at bedside and pt agreed to plan.  Labs Review Labs Reviewed - No data to display  Imaging Review No results found. I have personally reviewed and evaluated these images and lab results as part of my medical decision-making.   EKG Interpretation None      MDM   Final diagnoses:  None   Patient presents emergency department for headache. Headache gradually worsened throughout the day, it was not acute, I doubt serious intracranial abnormality. In addition it seems to be coming from her dental caries in her mouth. She was given dig history follow-up as she does not have a dentist. In the emergency department, she was given Toradol, Reglan, Benadryl, Norco for pain control. We'll discharge home with Tylenol to take as needed. Advised to start with Tylenol or ibuprofen first at home.  5:33 AM upon repeat evaluation, patient states the headache is resolved. She appears well in no acute distress, vital signs remain within her normal limits and she is safe for discharge.   I personally performed the services described in this documentation, which was scribed in my presence. The recorded information has been reviewed and is accurate.     Tomasita Crumble, MD 05/05/15 [phone]

## 2016-12-31 ENCOUNTER — Ambulatory Visit (HOSPITAL_COMMUNITY)
Admission: EM | Admit: 2016-12-31 | Discharge: 2016-12-31 | Disposition: A | Discharge status: Home / Self Care | Payer: Medicaid Other | Attending: Family Medicine | Admitting: Family Medicine

## 2016-12-31 ENCOUNTER — Encounter (HOSPITAL_COMMUNITY): Payer: Self-pay | Admitting: Emergency Medicine

## 2016-12-31 DIAGNOSIS — H6122 Impacted cerumen, left ear: Secondary | ICD-10-CM

## 2016-12-31 NOTE — ED Triage Notes (Signed)
Pt here for left ear fullness with hx of same

## 2017-01-01 NOTE — ED Provider Notes (Signed)
  [facility]   [id number] 12/31/16 Arrival Time: 1600  ASSESSMENT & PLAN:  1. Impacted cerumen of left ear    Much better after flushing. May f/u as needed.  Reviewed expectations re: course of current medical issues. Questions answered. Outlined signs and symptoms indicating need for more acute intervention. Patient verbalized understanding. After Visit Summary given.   SUBJECTIVE:  Meagan Ross is a 26 y.o. female who presents with complaint of "my left ear is clogged." Gradually over several days. Slightly decreased hearing. No pain or ear drainage. No OTC treatment. No specific aggravating or alleviating factors reported. H/O cerumen impaction in the past.  Social History   Tobacco Use  Smoking Status Never Smoker    ROS: As per HPI.   OBJECTIVE:  Vitals:   12/31/16 1636  BP: (!) 116/59  Pulse: 73  Resp: 18  Temp: 98.6 F (37 C)  TempSrc: Oral  SpO2: 100%     General appearance: alert; no distress HEENT: cerumen impaction of L EAC; after flushing TM is normal Neck: supple without LAD Skin: warm and dry Psychological: alert and cooperative; normal mood and affect   Allergies  Allergen Reactions  . Penicillins Rash    Past Medical History:  Diagnosis Date  . Asthma   . Bronchitis   . Headache      Social History   Socioeconomic History  . Marital status: Single    Spouse name: Not on file  . Number of children: Not on file  . Years of education: Not on file  . Highest education level: Not on file  Social Needs  . Financial resource strain: Not on file  . Food insecurity - worry: Not on file  . Food insecurity - inability: Not on file  . Transportation needs - medical: Not on file  . Transportation needs - non-medical: Not on file  Occupational History  . Not on file  Tobacco Use  . Smoking status: Never Smoker  Substance and Sexual Activity  . Alcohol use: No  . Drug use: No  . Sexual activity: Not on file  Other  Topics Concern  . Not on file  Social History Narrative  . Not on file           Mardella LaymanHagler, Kayloni Rocco, MD 01/01/17 [phone]

## 2017-03-25 ENCOUNTER — Ambulatory Visit (HOSPITAL_COMMUNITY)
Admission: EM | Admit: 2017-03-25 | Discharge: 2017-03-25 | Disposition: A | Discharge status: Home / Self Care | Payer: Medicaid Other | Attending: Family Medicine | Admitting: Family Medicine

## 2017-03-25 ENCOUNTER — Other Ambulatory Visit: Payer: Self-pay

## 2017-03-25 ENCOUNTER — Encounter (HOSPITAL_COMMUNITY): Payer: Self-pay | Admitting: Emergency Medicine

## 2017-03-25 DIAGNOSIS — R1032 Left lower quadrant pain: Secondary | ICD-10-CM | POA: Diagnosis not present

## 2017-03-25 DIAGNOSIS — R350 Frequency of micturition: Secondary | ICD-10-CM | POA: Insufficient documentation

## 2017-03-25 DIAGNOSIS — F17210 Nicotine dependence, cigarettes, uncomplicated: Secondary | ICD-10-CM | POA: Diagnosis not present

## 2017-03-25 DIAGNOSIS — R102 Pelvic and perineal pain: Secondary | ICD-10-CM | POA: Insufficient documentation

## 2017-03-25 DIAGNOSIS — Z880 Allergy status to penicillin: Secondary | ICD-10-CM | POA: Insufficient documentation

## 2017-03-25 DIAGNOSIS — R109 Unspecified abdominal pain: Secondary | ICD-10-CM

## 2017-03-25 LAB — HCG, QUANTITATIVE, PREGNANCY: HCG, BETA CHAIN, QUANT, S: 147 m[IU]/mL — AB (ref <5)

## 2017-03-25 LAB — POCT URINALYSIS DIP (DEVICE)
Bilirubin Urine: NEGATIVE
Glucose, UA: NEGATIVE mg/dL
Hgb urine dipstick: NEGATIVE
Ketones, ur: NEGATIVE mg/dL
LEUKOCYTES UA: NEGATIVE
NITRITE: NEGATIVE
PH: 5.5 (ref 5.0–8.0)
Protein, ur: NEGATIVE mg/dL
Specific Gravity, Urine: >=1.03 (ref 1.005–1.030)
UROBILINOGEN UA: 0.2 mg/dL (ref 0.0–1.0)

## 2017-03-25 LAB — POCT PREGNANCY, URINE: PREG TEST UR: NEGATIVE

## 2017-03-25 NOTE — ED Provider Notes (Signed)
  MRN: [id number] DOB: 08/16/1990  Subjective:   Meagan Ross is a 27 y.o. female presenting for 1 day history of sharp, cramp-like left flank pain that radiates anteriorly toward llq/pelvic area, loose stools (~3 bowel movements), subjective fever, urinary frequency. Denies n/v, dysuria, hematuria, genital rashes, vaginal discharge. Patient took pregnancy test and believes it was positive, saw a "faded" line. Hydrates with ~4 bottles of water daily. Does not drink alcohol. Smokes ~1/2 ppd. LMP was 03/02/2017.  Meagan Ross is not currently taking any medications and is allergic to penicillins.  Meagan Ross  has a past medical history of Asthma, Bronchitis, and Headache. Also  has a past surgical history that includes Nose surgery and Cesarean section.  Objective:   Vitals: BP (!) 106/48 (BP Location: Right Arm)   Pulse 68   Temp 98.4 F (36.9 C) (Oral)   Resp 18   SpO2 100%   Physical Exam  Constitutional: She is oriented to person, place, and time. She appears well-developed and well-nourished.  Cardiovascular: Normal rate, regular rhythm and intact distal pulses. Exam reveals no gallop and no friction rub.  No murmur heard. Pulmonary/Chest: No respiratory distress. She has no wheezes. She has no rales.  Abdominal: Soft. Bowel sounds are normal. She exhibits no distension and no mass. There is tenderness (by report only, not elicited on exam) in the left lower quadrant. There is no rigidity, no rebound, no guarding and no CVA tenderness.  Musculoskeletal: She exhibits no edema.  Neurological: She is alert and oriented to person, place, and time.  Skin: Skin is warm and dry. No rash noted. No erythema. No pallor.   Results for orders placed or performed during the hospital encounter of 03/25/17 (from the past 24 hour(s))  POCT urinalysis dip (device)     Status: None   Collection Time: 03/25/17  4:11 PM  Result Value Ref Range   Glucose, UA NEGATIVE NEGATIVE mg/dL   Bilirubin Urine  NEGATIVE NEGATIVE   Ketones, ur NEGATIVE NEGATIVE mg/dL   Specific Gravity, Urine >=1.030 1.005 - 1.030   Hgb urine dipstick NEGATIVE NEGATIVE   pH 5.5 5.0 - 8.0   Protein, ur NEGATIVE NEGATIVE mg/dL   Urobilinogen, UA 0.2 0.0 - 1.0 mg/dL   Nitrite NEGATIVE NEGATIVE   Leukocytes, UA NEGATIVE NEGATIVE  Pregnancy, urine POC     Status: None   Collection Time: 03/25/17  4:12 PM  Result Value Ref Range   Preg Test, Ur NEGATIVE NEGATIVE   Assessment and Plan :   Left flank pain  Abdominal pain, left lower quadrant  Pelvic pain in female  Beta hcg pending. Patient is concerned about her home pregnancy tests compared to our urine pregnancy test. Offered confirmatory beta hcg. Recommended adequate hydration, APAP for now. ER and return-to-clinic precautions discussed, patient verbalized understanding.    Wallis BambergMani, Shanequa Whit[place]ight, [place]PA-C 03/25/17 [phone]

## 2017-03-25 NOTE — ED Triage Notes (Signed)
Left lower abdominal pain and burning that patient noticed yesterday.  Patient reports having a positive home pregnancy test.  Denies burning with urination

## 2017-03-25 NOTE — Discharge Instructions (Signed)
Hydrate well with at least 2 liters (1 gallon) of water daily. You may take 500mg  Tylenol every 6 hours for pain and inflammation. We will call you with results of pregnancy test. The other labs will take a few days to result and generally we only call you for positive results but feel free to call our clinic and request results if you don't hear from us.

## 2017-03-26 ENCOUNTER — Telehealth (HOSPITAL_COMMUNITY): Payer: Self-pay | Admitting: Internal Medicine

## 2017-03-26 ENCOUNTER — Other Ambulatory Visit: Payer: Self-pay

## 2017-03-26 ENCOUNTER — Inpatient Hospital Stay (HOSPITAL_COMMUNITY): Payer: Medicaid Other

## 2017-03-26 ENCOUNTER — Inpatient Hospital Stay (HOSPITAL_COMMUNITY)
Admission: AD | Admit: 2017-03-26 | Discharge: 2017-03-26 | Disposition: A | Discharge status: Home / Self Care | Payer: Medicaid Other | Source: Ambulatory Visit | Attending: Obstetrics & Gynecology | Admitting: Obstetrics & Gynecology

## 2017-03-26 ENCOUNTER — Encounter (HOSPITAL_COMMUNITY): Payer: Self-pay | Admitting: *Deleted

## 2017-03-26 DIAGNOSIS — O99331 Smoking (tobacco) complicating pregnancy, first trimester: Secondary | ICD-10-CM | POA: Diagnosis not present

## 2017-03-26 DIAGNOSIS — Z3A01 Less than 8 weeks gestation of pregnancy: Secondary | ICD-10-CM | POA: Insufficient documentation

## 2017-03-26 DIAGNOSIS — F17210 Nicotine dependence, cigarettes, uncomplicated: Secondary | ICD-10-CM | POA: Insufficient documentation

## 2017-03-26 DIAGNOSIS — O26899 Other specified pregnancy related conditions, unspecified trimester: Secondary | ICD-10-CM

## 2017-03-26 DIAGNOSIS — O26891 Other specified pregnancy related conditions, first trimester: Secondary | ICD-10-CM | POA: Diagnosis not present

## 2017-03-26 DIAGNOSIS — R109 Unspecified abdominal pain: Secondary | ICD-10-CM

## 2017-03-26 DIAGNOSIS — Z880 Allergy status to penicillin: Secondary | ICD-10-CM | POA: Diagnosis not present

## 2017-03-26 DIAGNOSIS — O3680X0 Pregnancy with inconclusive fetal viability, not applicable or unspecified: Secondary | ICD-10-CM

## 2017-03-26 LAB — CBC
HCT: 35.9 % — ABNORMAL LOW (ref 36.0–46.0)
HEMOGLOBIN: 12 g/dL (ref 12.0–15.0)
MCH: 29.6 pg (ref 26.0–34.0)
MCHC: 33.4 g/dL (ref 30.0–36.0)
MCV: 88.4 fL (ref 78.0–100.0)
Platelets: 284 10*3/uL (ref 150–400)
RBC: 4.06 MIL/uL (ref 3.87–5.11)
RDW: 13.2 % (ref 11.5–15.5)
WBC: 7.9 10*3/uL (ref 4.0–10.5)

## 2017-03-26 LAB — URINALYSIS, ROUTINE W REFLEX MICROSCOPIC
Bilirubin Urine: NEGATIVE
GLUCOSE, UA: NEGATIVE mg/dL
Hgb urine dipstick: NEGATIVE
Ketones, ur: NEGATIVE mg/dL
LEUKOCYTES UA: NEGATIVE
Nitrite: NEGATIVE
PH: 6 (ref 5.0–8.0)
PROTEIN: NEGATIVE mg/dL
SPECIFIC GRAVITY, URINE: 1.013 (ref 1.005–1.030)

## 2017-03-26 LAB — URINE CULTURE: CULTURE: NO GROWTH

## 2017-03-26 LAB — ABO/RH: ABO/RH(D): O POS

## 2017-03-26 LAB — URINE CYTOLOGY ANCILLARY ONLY
Chlamydia: NEGATIVE
Neisseria Gonorrhea: NEGATIVE
Trichomonas: NEGATIVE

## 2017-03-26 LAB — HCG, QUANTITATIVE, PREGNANCY: HCG, BETA CHAIN, QUANT, S: 213 m[IU]/mL — AB (ref <5)

## 2017-03-26 NOTE — Telephone Encounter (Signed)
Called patient to make her aware of positive serum pregnancy test, drawn last night at urgent care.  She is not having pelvic pain today but is feeling dizzy/lightheaded.  Re[facility] for [facility].  LM

## 2017-03-26 NOTE — Progress Notes (Addendum)
G4P2 @ 4.[redacted] wksga according to LMP-12/27. Left side pain for 2 days and had gone to urgent care yesterday and UPT negative for pregn so drew lab and that was + per pt. [facility]. Denies bleeding.   Hx of 2 prior c/s for breech and repeat.   Medical hx: asthma - attack "few weeks ago" and PRN med taken  To U/s taken by tech.  1530: Provider at bs reassessing and going over results of labs and u/s  1535: d/c instructions given with pt understanding. Pt left unit with SO.

## 2017-03-26 NOTE — Discharge Instructions (Signed)
Return to care  °· If you have heavier bleeding that soaks through more that 2 pads per hour for an hour or more °· If you bleed so much that you feel like you might pass out or you do pass out °· If you have significant abdominal pain that is not improved with Tylenol  °· If you develop a fever > 100.5 ° °

## 2017-03-26 NOTE — MAU Note (Signed)
Pt reports she started having pain on her left side 2 days ago. Went to Urgent care and had a positive blood test. [facility]

## 2017-03-26 NOTE — MAU Provider Note (Signed)
History     CSN: [id number]  Arrival date and time: 03/26/17 1205   First Provider Initiated Contact with Patient 03/26/17 1400      Chief Complaint  Patient presents with  . Abdominal Pain   HPI Meagan Ross is a 27 y.o. [id number] at [redacted]w[redacted]d by LMP who presents with abdominal pain. Symptoms began 3 days ago. Reports intermittent lower abdominal cramping that has improved today without intervention. Denies n/v/d, constipation, dysuria, vaginal discharge, or vaginal bleeding.  Was at urgent care yesterday & had blood test to confirm pregnancy. Was referred to MAU d/t abdominal pain.   OB History    Gravida Para Term Preterm AB Living   4 2 2   1 2    SAB TAB Ectopic Multiple Live Births     1     2      Past Medical History:  Diagnosis Date  . Asthma   . Bronchitis   . Headache     Past Surgical History:  Procedure Laterality Date  . CESAREAN SECTION     breech and 2nd for RC/S  . NOSE SURGERY      Family History  Problem Relation Age of Onset  . Healthy Mother     Social History   Tobacco Use  . Smoking status: Current Every Day Smoker    Packs/day: 0.50    Years: 5.00    Pack years: 2.50    Types: Cigarettes  . Smokeless tobacco: Never Used  . Tobacco comment: states "need to"  Substance Use Topics  . Alcohol use: No    Frequency: Never  . Drug use: No    Allergies:  Allergies  Allergen Reactions  . Penicillins Rash    No medications prior to admission.    Review of Systems  Constitutional: Negative.   Gastrointestinal: Positive for abdominal pain. Negative for constipation, diarrhea, nausea and vomiting.  Genitourinary: Negative.    Physical Exam   Blood pressure (!) 116/47, pulse 69, temperature 98.4 F (36.9 C), temperature source Oral, resp. rate 16, height 5\' 4"  (1.626 m), weight 185 lb (83.9 kg), last menstrual period 02/22/2017, unknown if currently breastfeeding.  Physical Exam  Nursing note and vitals reviewed. Constitutional:  She is oriented to person, place, and time. She appears well-developed and well-nourished. No distress.  HENT:  Head: Normocephalic and atraumatic.  Eyes: Conjunctivae are normal. Right eye exhibits no discharge. Left eye exhibits no discharge. No scleral icterus.  Neck: Normal range of motion.  Respiratory: Effort normal. No respiratory distress.  GI: Soft. She exhibits no distension. There is no tenderness. There is no rebound and no guarding.  Neurological: She is alert and oriented to person, place, and time.  Skin: Skin is warm and dry. She is not diaphoretic.  Psychiatric: She has a normal mood and affect. Her behavior is normal. Judgment and thought content normal.    MAU Course  Procedures Results for orders placed or performed during the hospital encounter of 03/26/17 (from the past 24 hour(s))  Urinalysis, Routine w reflex microscopic     Status: None   Collection Time: 03/26/17 12:43 PM  Result Value Ref Range   Color, Urine YELLOW YELLOW   APPearance CLEAR CLEAR   Specific Gravity, Urine 1.013 1.005 - 1.030   pH 6.0 5.0 - 8.0   Glucose, UA NEGATIVE NEGATIVE mg/dL   Hgb urine dipstick NEGATIVE NEGATIVE   Bilirubin Urine NEGATIVE NEGATIVE   Ketones, ur NEGATIVE NEGATIVE mg/dL   Protein, ur NEGATIVE  NEGATIVE mg/dL   Nitrite NEGATIVE NEGATIVE   Leukocytes, UA NEGATIVE NEGATIVE  CBC     Status: Abnormal   Collection Time: 03/26/17  1:42 PM  Result Value Ref Range   WBC 7.9 4.0 - 10.5 K/uL   RBC 4.06 3.87 - 5.11 MIL/uL   Hemoglobin 12.0 12.0 - 15.0 g/dL   HCT 16.135.9 (L) 09.636.0 - 04.546.0 %   MCV 88.4 78.0 - 100.0 fL   MCH 29.6 26.0 - 34.0 pg   MCHC 33.4 30.0 - 36.0 g/dL   RDW 40.913.2 81.111.5 - 91.415.5 %   Platelets 284 150 - 400 K/uL  ABO/Rh     Status: None   Collection Time: 03/26/17  1:42 PM  Result Value Ref Range   ABO/RH(D)      O POS Performed at [facility], [address] [place], [place] [address]   hCG, quantitative, pregnancy     Status: Abnormal   Collection  Time: 03/26/17  1:42 PM  Result Value Ref Range   hCG, Beta Chain, Quant, S 213 (H) <5 mIU/mL   [place] Ob Less Than 14 Weeks With Ob Transvaginal  Result Date: 03/26/2017 CLINICAL DATA:  Abdominal pain affecting first trimester of pregnancy, LEFT side pain for 2 days, 4 weeks 4 days EGA by LMP, quantitative beta HCG = 213 today, was = 147 on 03/25/2017 EXAM: OBSTETRIC <14 WK US AND TRANSVAGINAL OB US TECHNIQUE: Both transabdominal and transvaginal ultrasound examinations were performed for complete evaluation of the gestation as well as the maternal uterus, adnexal regions, and pelvic cul-de-sac. Transvaginal technique was performed to assess early pregnancy. COMPARISON:  None FINDINGS: Intrauterine gestational sac: Potential tiny eccentric gestional sac Yolk sac:  Absent Embryo:  Absent Cardiac Activity: N/A Heart Rate: N/A  bpm Subchorionic hemorrhage:  None visualized. Maternal uterus/adnexae: No uterine mass. LEFT ovary normal size and morphology 3.8 x 2.7 x 3.4 cm. RIGHT ovary normal size and morphology 2.8 x 1.5 x 1.9 cm. Small amount of simple appearing free fluid. No adnexal masses. IMPRESSION: Tiny fluid collection eccentric in within the endometrium at the upper uterine segment, not definitive but could potentially represent a tiny gestational sac. Small amount of nonspecific simple appearing free pelvic fluid. Recommend follow-up ultrasound in 10-14 days to confirm IUP and exclude ectopic pregnancy. Electronically Signed   By: Ulyses SouthwardMark  Boles M.D.   On: 03/26/2017 15:14    MDM +UPT UA, CBC, ABO/Rh, quant hCG, HIV, and US today to rule out ectopic pregnancy HCG drawn yesterday when patient at urgent care = 147, today hcg is 213 Ultrasound shows no iup or adnexal mass  This abdominal pain could represent a normal pregnancy, spontaneous abortion, or even an ectopic pregnancy which can be life-threatening. Assessment and Plan  A: 1. Pregnancy of unknown anatomic location   2. Abdominal pain  affecting pregnancy    P: Discharge home Return to MAU on Saturday for repeat BHCG Discussed reasons to return sooner including ectopic precautions  Judeth Hornrin Dedric Ethington 03/26/2017, 2:01 PM

## 2017-03-27 LAB — URINE CYTOLOGY ANCILLARY ONLY: CANDIDA VAGINITIS: NEGATIVE

## 2017-04-03 ENCOUNTER — Other Ambulatory Visit: Payer: Self-pay

## 2017-04-06 ENCOUNTER — Other Ambulatory Visit: Payer: Self-pay | Admitting: *Deleted

## 2017-04-06 DIAGNOSIS — O3680X0 Pregnancy with inconclusive fetal viability, not applicable or unspecified: Secondary | ICD-10-CM

## 2017-04-06 NOTE — Progress Notes (Unsigned)
bhcg

## 2017-04-07 ENCOUNTER — Other Ambulatory Visit: Payer: Self-pay

## 2017-04-07 DIAGNOSIS — O3680X0 Pregnancy with inconclusive fetal viability, not applicable or unspecified: Secondary | ICD-10-CM

## 2017-04-08 LAB — BETA HCG QUANT (REF LAB): HCG QUANT: 12211 m[IU]/mL

## 2017-04-09 ENCOUNTER — Telehealth: Payer: Self-pay | Admitting: General Practice

## 2017-04-09 DIAGNOSIS — O3680X0 Pregnancy with inconclusive fetal viability, not applicable or unspecified: Secondary | ICD-10-CM

## 2017-04-09 NOTE — Telephone Encounter (Signed)
Scheduled patient for follow up ultrasound 3/18 @ 145. Called patient, no answer- left message stating we are trying to reach you with results and regarding an appt. I will send you a mychart message with more information

## 2017-04-13 ENCOUNTER — Ambulatory Visit (HOSPITAL_COMMUNITY): Payer: Self-pay

## 2017-04-14 ENCOUNTER — Ambulatory Visit (HOSPITAL_COMMUNITY)
Admission: RE | Admit: 2017-04-14 | Discharge: 2017-04-14 | Disposition: A | Discharge status: Home / Self Care | Payer: Medicaid Other | Source: Ambulatory Visit | Attending: Student | Admitting: Student

## 2017-04-14 ENCOUNTER — Encounter: Payer: Self-pay | Admitting: General Practice

## 2017-04-14 ENCOUNTER — Ambulatory Visit: Payer: Medicaid Other

## 2017-04-14 DIAGNOSIS — O3680X0 Pregnancy with inconclusive fetal viability, not applicable or unspecified: Secondary | ICD-10-CM

## 2017-04-14 DIAGNOSIS — Z3A01 Less than 8 weeks gestation of pregnancy: Secondary | ICD-10-CM | POA: Diagnosis not present

## 2017-04-14 DIAGNOSIS — O208 Other hemorrhage in early pregnancy: Secondary | ICD-10-CM | POA: Diagnosis not present

## 2017-04-14 DIAGNOSIS — Z712 Person consulting for explanation of examination or test findings: Secondary | ICD-10-CM

## 2017-04-14 NOTE — Progress Notes (Signed)
Pt presents to the office for US results. Cardiac activity, embryo, and yolk sac visualized. Heart rate 119. Pt is 6w 6d. Small subchorionic hemorrhage also visualized. Explained to pt that light spotting is normal and to come in if she starts to bleed like a period. Pt advised to start prenatal care and PNV. Pt verbalized understanding and had no questions.

## 2017-04-14 NOTE — Progress Notes (Signed)
Chart reviewed - agree with CMA documentation.   

## 2017-05-21 ENCOUNTER — Encounter: Payer: Self-pay | Admitting: Advanced Practice Midwife

## 2017-05-21 ENCOUNTER — Ambulatory Visit (INDEPENDENT_AMBULATORY_CARE_PROVIDER_SITE_OTHER): Payer: Medicaid Other | Admitting: Advanced Practice Midwife

## 2017-05-21 VITALS — BP 103/58 | HR 65 | Wt 194.3 lb

## 2017-05-21 DIAGNOSIS — R002 Palpitations: Secondary | ICD-10-CM | POA: Insufficient documentation

## 2017-05-21 DIAGNOSIS — Z98891 History of uterine scar from previous surgery: Secondary | ICD-10-CM

## 2017-05-21 DIAGNOSIS — Z3481 Encounter for supervision of other normal pregnancy, first trimester: Secondary | ICD-10-CM

## 2017-05-21 DIAGNOSIS — Z3480 Encounter for supervision of other normal pregnancy, unspecified trimester: Secondary | ICD-10-CM

## 2017-05-21 LAB — POCT URINALYSIS DIP (DEVICE)
Bilirubin Urine: NEGATIVE
GLUCOSE, UA: NEGATIVE mg/dL
Hgb urine dipstick: NEGATIVE
Ketones, ur: NEGATIVE mg/dL
Leukocytes, UA: NEGATIVE
NITRITE: NEGATIVE
PROTEIN: NEGATIVE mg/dL
Specific Gravity, Urine: 1.02 (ref 1.005–1.030)
UROBILINOGEN UA: 1 mg/dL (ref 0.0–1.0)
pH: 7 (ref 5.0–8.0)

## 2017-05-21 NOTE — Progress Notes (Signed)
   PRENATAL VISIT NOTE  Subjective:  Meagan Ross is a 27 y.o. [id number] at 9325w4d being seen today for initial prenatal visit.  She is currently monitored for the following issues for this low-risk pregnancy and has Supervision of other normal pregnancy, antepartum on their problem list.  Patient reports no complaints.  Contractions: Not present. Vag. Bleeding: None.  Movement: Present. Denies leaking of fluid.   The following portions of the patient's history were reviewed and updated as appropriate: allergies, current medications, past family history, past medical history, past social history, past surgical history and problem list. Problem list updated.  Objective:   Vitals:   05/21/17 1129  BP: (!) 103/58  Pulse: 65  Weight: 194 lb 4.8 oz (88.1 kg)    Fetal Status: Fetal Heart Rate (bpm): 147    Movement: Present    VS reviewed, nursing note reviewed,  Constitutional: well developed, well nourished, no distress HEART: normal rate, heart sounds, regular rhythm RESP: normal effort, lung sounds clear and equal bilaterally Pulm/chest wall: normal effort Abdomen: soft Neuro: alert and oriented x 3 Skin: warm, dry Psych: affect normal  Pelvic deferred, last pap 1 year ago  Assessment and Plan:  Pregnancy: [id number] at 3725w4d  1. Supervision of other normal pregnancy, antepartum --Medical/OB history reviewed --Pt desires genetic screening, and wants to determine gender with NIPS --Lab closed for the day, not open this afternoon, so pt to call to reschedule lab only visit at her convenience --F/U in 4 weeks - [facility] BABYSCRIPTS OPT IN --Pap within last year per pt, records requested.  2. History of 2 cesarean sections --First for breech, second because VBAC not offered in [place]. --Interested in vaginal delivery but Ok with repeat if needed --Discussed some benefits/risks of VBAC and repeat C/S with pt, increased risk if 2 prior C/S --Will continue to discuss during  pregnancy  3. Heart palpitations --Pt has hx of mild palpitations during prior pregnancy only, no cardiology f/u in the past --No dizziness, chest pain, shortness of breath, just sensation of fast HR --Continue to monitor, consider cardiology referral if worsening or persistent  Preterm labor symptoms and general obstetric precautions including but not limited to vaginal bleeding, contractions, leaking of fluid and fetal movement were reviewed in detail with the patient. Please refer to After Visit Summary for other counseling recommendations.  Return in about 1 month (around 06/18/2017).  No future appointments.  Sharen CounterLisa Leftwich-Kirby, CNM

## 2017-05-21 NOTE — Patient Instructions (Signed)

## 2017-05-22 ENCOUNTER — Other Ambulatory Visit: Payer: Medicaid Other

## 2017-05-25 ENCOUNTER — Other Ambulatory Visit: Payer: Self-pay | Admitting: Advanced Practice Midwife

## 2017-05-25 DIAGNOSIS — O99330 Smoking (tobacco) complicating pregnancy, unspecified trimester: Secondary | ICD-10-CM | POA: Insufficient documentation

## 2017-05-25 MED ORDER — NICOTINE 14 MG/24HR TD PT24: 14.0000 mg | MEDICATED_PATCH | Freq: Every day | TRANSDERMAL | 0 refills | Status: DC

## 2017-05-25 NOTE — Progress Notes (Signed)
Discussed smoking with pt on 05/21/17.  Pt motivated to quit. Knows risks of nicotine and pregnancy, reviewed these again at prenatal visit and discussed increased risk of SIDS if smoking in household. Discussed options. Pt would like to try nicotine patch for short term plan to reduce/quit cigarettes. Discussed risk of nicotine associated with pregnancy.  Pt interested in trying patch.  Pt smokes 10 cigarettes-0.5 pack/day. Rx for nicotine patch 14 mg daily with no refills.

## 2017-06-01 ENCOUNTER — Other Ambulatory Visit: Payer: Medicaid Other

## 2017-06-01 DIAGNOSIS — Z3480 Encounter for supervision of other normal pregnancy, unspecified trimester: Secondary | ICD-10-CM

## 2017-06-02 ENCOUNTER — Encounter: Payer: Self-pay | Admitting: *Deleted

## 2017-06-08 LAB — SMN1 COPY NUMBER ANALYSIS (SMA CARRIER SCREENING)

## 2017-06-08 LAB — HEMOGLOBINOPATHY EVALUATION
FERRITIN: 16 ng/mL (ref 15–150)
HGB A2 QUANT: 2.4 % (ref 1.8–3.2)
HGB A: 97.6 % (ref 96.4–98.8)
HGB C: 0 %
HGB F QUANT: 0 % (ref 0.0–2.0)
Hgb S: 0 %
Hgb Solubility: NEGATIVE
Hgb Variant: 0 %

## 2017-06-08 LAB — OB RESULTS CONSOLE HEPATITIS B SURFACE ANTIGEN: Hepatitis B Surface Ag: NEGATIVE

## 2017-06-08 LAB — OBSTETRIC PANEL, INCLUDING HIV
Antibody Screen: NEGATIVE
Basophils Absolute: 0 10*3/uL (ref 0.0–0.2)
Basos: 0 %
EOS (ABSOLUTE): 0.2 10*3/uL (ref 0.0–0.4)
Eos: 3 %
HIV Screen 4th Generation wRfx: NONREACTIVE
Hematocrit: 35.5 % (ref 34.0–46.6)
Hemoglobin: 11.6 g/dL (ref 11.1–15.9)
Hepatitis B Surface Ag: NEGATIVE
IMMATURE GRANS (ABS): 0 10*3/uL (ref 0.0–0.1)
IMMATURE GRANULOCYTES: 0 %
LYMPHS: 28 %
Lymphocytes Absolute: 2 10*3/uL (ref 0.7–3.1)
MCH: 29.4 pg (ref 26.6–33.0)
MCHC: 32.7 g/dL (ref 31.5–35.7)
MCV: 90 fL (ref 79–97)
MONOCYTES: 8 %
Monocytes Absolute: 0.6 10*3/uL (ref 0.1–0.9)
NEUTROS PCT: 61 %
Neutrophils Absolute: 4.5 10*3/uL (ref 1.4–7.0)
Platelets: 323 10*3/uL (ref 150–379)
RBC: 3.94 x10E6/uL (ref 3.77–5.28)
RDW: 13.2 % (ref 12.3–15.4)
RPR: NONREACTIVE
RUBELLA: 2.72 {index} (ref >0.99)
Rh Factor: POSITIVE
WBC: 7.4 10*3/uL (ref 3.4–10.8)

## 2017-06-08 LAB — OB RESULTS CONSOLE ANTIBODY SCREEN: Antibody Screen: NEGATIVE

## 2017-06-08 LAB — OB RESULTS CONSOLE ABO/RH: RH TYPE: POSITIVE

## 2017-06-08 LAB — OB RESULTS CONSOLE RUBELLA ANTIBODY, IGM: Rubella: IMMUNE

## 2017-06-08 LAB — OB RESULTS CONSOLE RPR: RPR: NONREACTIVE

## 2017-06-08 LAB — OB RESULTS CONSOLE GC/CHLAMYDIA
CHLAMYDIA, DNA PROBE: NEGATIVE
GC PROBE AMP, GENITAL: NEGATIVE

## 2017-06-08 LAB — OB RESULTS CONSOLE HIV ANTIBODY (ROUTINE TESTING): HIV: NONREACTIVE

## 2017-06-09 ENCOUNTER — Encounter (HOSPITAL_COMMUNITY): Payer: Self-pay | Admitting: *Deleted

## 2017-06-15 ENCOUNTER — Telehealth: Payer: Self-pay | Admitting: Family Medicine

## 2017-06-15 NOTE — Telephone Encounter (Signed)
Patient is calling and requesting her Genetic Screening, she said it wasn't added to her MyChart

## 2017-06-16 NOTE — Telephone Encounter (Signed)
Left message informing patient results would be discussed at next appointment.

## 2017-06-17 ENCOUNTER — Encounter: Payer: Medicaid Other | Admitting: Advanced Practice Midwife

## 2017-06-17 ENCOUNTER — Telehealth: Payer: Self-pay | Admitting: General Practice

## 2017-06-17 NOTE — Telephone Encounter (Signed)
Patient missed appointment this morning. Called patient to reschedule for Friday, 06/19/17 at 1:55pm.  Patient voiced understanding.

## 2017-06-18 ENCOUNTER — Encounter: Payer: Medicaid Other | Admitting: Student

## 2017-06-19 ENCOUNTER — Encounter: Payer: Medicaid Other | Admitting: Obstetrics and Gynecology

## 2017-06-23 ENCOUNTER — Encounter: Payer: Self-pay | Admitting: *Deleted

## 2017-09-30 ENCOUNTER — Other Ambulatory Visit: Payer: Self-pay

## 2017-09-30 ENCOUNTER — Inpatient Hospital Stay (HOSPITAL_COMMUNITY)
Admission: AD | Admit: 2017-09-30 | Discharge: 2017-09-30 | Disposition: A | Discharge status: Home / Self Care | Payer: Medicaid Other | Source: Ambulatory Visit | Attending: Obstetrics and Gynecology | Admitting: Obstetrics and Gynecology

## 2017-09-30 ENCOUNTER — Encounter (HOSPITAL_COMMUNITY): Payer: Self-pay | Admitting: *Deleted

## 2017-09-30 DIAGNOSIS — O26893 Other specified pregnancy related conditions, third trimester: Secondary | ICD-10-CM | POA: Insufficient documentation

## 2017-09-30 DIAGNOSIS — O99513 Diseases of the respiratory system complicating pregnancy, third trimester: Secondary | ICD-10-CM | POA: Diagnosis not present

## 2017-09-30 DIAGNOSIS — Z3689 Encounter for other specified antenatal screening: Secondary | ICD-10-CM | POA: Diagnosis not present

## 2017-09-30 DIAGNOSIS — Z3A31 31 weeks gestation of pregnancy: Secondary | ICD-10-CM | POA: Insufficient documentation

## 2017-09-30 DIAGNOSIS — O99333 Smoking (tobacco) complicating pregnancy, third trimester: Secondary | ICD-10-CM | POA: Insufficient documentation

## 2017-09-30 DIAGNOSIS — J45909 Unspecified asthma, uncomplicated: Secondary | ICD-10-CM | POA: Insufficient documentation

## 2017-09-30 DIAGNOSIS — R51 Headache: Secondary | ICD-10-CM | POA: Diagnosis not present

## 2017-09-30 LAB — CBC
HCT: 30.5 % — ABNORMAL LOW (ref 36.0–46.0)
HEMOGLOBIN: 10.1 g/dL — AB (ref 12.0–15.0)
MCH: 29.4 pg (ref 26.0–34.0)
MCHC: 33.1 g/dL (ref 30.0–36.0)
MCV: 88.9 fL (ref 78.0–100.0)
PLATELETS: 246 10*3/uL (ref 150–400)
RBC: 3.43 MIL/uL — ABNORMAL LOW (ref 3.87–5.11)
RDW: 14.3 % (ref 11.5–15.5)
WBC: 7.5 10*3/uL (ref 4.0–10.5)

## 2017-09-30 LAB — URINALYSIS, ROUTINE W REFLEX MICROSCOPIC
BILIRUBIN URINE: NEGATIVE
Glucose, UA: NEGATIVE mg/dL
Hgb urine dipstick: NEGATIVE
KETONES UR: NEGATIVE mg/dL
NITRITE: NEGATIVE
Protein, ur: NEGATIVE mg/dL
SPECIFIC GRAVITY, URINE: 1.008 (ref 1.005–1.030)
pH: 7 (ref 5.0–8.0)

## 2017-09-30 MED ORDER — BUTALBITAL-APAP-CAFFEINE 50-325-40 MG PO TABS
1.0000 | ORAL_TABLET | ORAL | Status: DC | PRN
  Administered 2017-09-30: 1 via ORAL
  Filled 2017-09-30: qty 1

## 2017-09-30 MED ORDER — BUTALBITAL-APAP-CAFFEINE 50-325-40 MG PO TABS: 1.0000 | ORAL_TABLET | ORAL | 0 refills | Status: DC | PRN

## 2017-09-30 NOTE — MAU Note (Signed)
Pt presents to MAU with complaints of nose bleed and headache since yesterday. States that her nose bleeds when she blows it. Pelvic pressure at times

## 2017-09-30 NOTE — Discharge Instructions (Signed)
General Headache Without Cause A headache is pain or discomfort felt around the head or neck area. There are many causes and types of headaches. In some cases, the cause may not be found. Follow these instructions at home: Managing pain  Take over-the-counter and prescription medicines only as told by your doctor.  Lie down in a dark, quiet room when you have a headache.  If directed, apply ice to the head and neck area: ? Put ice in a plastic bag. ? Place a towel between your skin and the bag. ? Leave the ice on for 20 minutes, 2-3 times per day.  Use a heating pad or hot shower to apply heat to the head and neck area as told by your doctor.  Keep lights dim if bright lights bother you or make your headaches worse. Eating and drinking  Eat meals on a regular schedule.  Lessen how much alcohol you drink.  Lessen how much caffeine you drink, or stop drinking caffeine. General instructions  Keep all follow-up visits as told by your doctor. This is important.  Keep a journal to find out if certain things bring on headaches. For example, write down: ? What you eat and drink. ? How much sleep you get. ? Any change to your diet or medicines.  Relax by getting a massage or doing other relaxing activities.  Lessen stress.  Sit up straight. Do not tighten (tense) your muscles.  Do not use tobacco products. This includes cigarettes, chewing tobacco, or e-cigarettes. If you need help quitting, ask your doctor.  Exercise regularly as told by your doctor.  Get enough sleep. This often means 7-9 hours of sleep. Contact a doctor if:  Your symptoms are not helped by medicine.  You have a headache that feels different than the other headaches.  You feel sick to your stomach (nauseous) or you throw up (vomit).  You have a fever. Get help right away if:  Your headache becomes really bad.  You keep throwing up.  You have a stiff neck.  You have trouble seeing.  You have  trouble speaking.  You have pain in the eye or ear.  Your muscles are weak or you lose muscle control.  You lose your balance or have trouble walking.  You feel like you will pass out (faint) or you pass out.  You have confusion. This information is not intended to replace advice given to you by your health care provider. Make sure you discuss any questions you have with your health care provider. Document Released: 10/23/2007 Document Revised: 06/21/2015 Document Reviewed: 05/08/2014 Elsevier Interactive Patient Education  2018 [employer].   Nosebleed, Adult A nosebleed is when blood comes out of the nose. Nosebleeds are common. Usually, they are not a sign of a serious condition. Nosebleeds can happen if a small blood vessel in your nose starts to bleed or if the lining of your nose (mucous membrane) cracks. They are commonly caused by:  Allergies.  Colds.  Picking your nose.  Blowing your nose too hard.  An injury from sticking an object into your nose or getting hit in the nose.  Dry or cold air.  Less common causes of nosebleeds include:  Toxic fumes.  Something abnormal in the nose or in the air-filled spaces in the bones of the face (sinuses).  Growths in the nose, such as polyps.  Medicines or conditions that cause blood to clot slowly.  Certain illnesses or procedures that irritate or dry out the  nasal passages.  Follow these instructions at home: When you have a nosebleed:  Sit down and tilt your head slightly forward.  Use a clean towel or tissue to pinch your nostrils under the bony part of your nose. After 10 minutes, let go of your nose and see if bleeding starts again. Do not release pressure before that time. If there is still bleeding, repeat the pinching and holding for 10 minutes until the bleeding stops.  Do not place tissues or gauze in the nose to stop bleeding.  Avoid lying down and avoid tilting your head backward. That may make blood  collect in the throat and cause gagging or coughing.  Use a nasal spray decongestant to help with a nosebleed as told by your health care provider.  Do not use petroleum jelly or mineral oil in your nose. It can drip into your lungs. After a nosebleed:  Avoid blowing your nose or sniffing for a number of hours.  Avoid straining, lifting, or bending at the waist for several days. You may resume other normal activities as you are able.  Use saline spray or a humidifier as told by your health care provider.  Aspirinand blood thinners make bleeding more likely. If you are prescribed these medicines and you suffer from nosebleeds: ? Ask your health care provider if you should stop taking the medicines or if you should adjust the dose. ? Do not stop taking medicines that your health care provider has recommended unless told by your health care provider.  If your nosebleed was caused by dry mucous membranes, use over-the-counter saline nasal spray or gel. This will keep the mucous membranes moist and allow them to heal. If you must use a lubricant: ? Choose one that is water-soluble. ? Use only as much as you need and use it only as often as needed. ? Do not lie down until several hours after you use it. Contact a health care provider if:  You have a fever.  You get nosebleeds often or more often than usual.  You bruise very easily.  You have a nosebleed from having something stuck in your nose.  You have bleeding in your mouth.  You vomit or cough up brown material.  You have a nosebleed after you start a new medicine. Get help right away if:  You have a nosebleed after a fall or a head injury.  Your nosebleed does not go away after 20 minutes.  You feel dizzy or weak.  You have unusual bleeding from other parts of your body.  You have unusual bruising on other parts of your body.  You become sweaty.  You vomit blood. This information is not intended to replace advice  given to you by your health care provider. Make sure you discuss any questions you have with your health care provider. Document Released: 10/23/2004 Document Revised: 09/13/2015 Document Reviewed: 07/31/2015 Elsevier Interactive Patient Education  [employer].

## 2017-09-30 NOTE — MAU Provider Note (Addendum)
History     CSN: [id number]  Arrival date and time: 09/30/17 1436   First Provider Initiated Contact with Patient 09/30/17 1507      Chief Complaint  Patient presents with  . Headache  . Epistaxis   [id number] @31 .3 wks here with HA and nosebleeds. HA started 1 week ago. HA is located frontal. No associated visual disturbances. Feels lightheaded just before HA. No syncope. Rates pain 9/10. Has not taken anything for it. Nosebleed happened twice, both times after she blew her nose. Had some blood and clots mixed with clear mucous but did not require pressure to make it stop. Denies cold sx. Endorses nasal pressure which prompted her to blow her nose. Admits to poor water intake, drinks mostly soda. Good FM. No VB or ctx.   OB History    Gravida  5   Para  2   Term  2   Preterm      AB  2   Living  2     SAB  1   TAB  1   Ectopic      Multiple      Live Births  2           Past Medical History:  Diagnosis Date  . Asthma   . Bronchitis   . Headache     Past Surgical History:  Procedure Laterality Date  . CESAREAN SECTION     breech and 2nd for RC/S  . NOSE SURGERY      History reviewed. No pertinent family history.  Social History   Tobacco Use  . Smoking status: Current Every Day Smoker    Packs/day: 0.50    Years: 5.00    Pack years: 2.50    Types: Cigarettes  . Smokeless tobacco: Never Used  . Tobacco comment: states "need to"  Substance Use Topics  . Alcohol use: No    Frequency: Never  . Drug use: No    Allergies:  Allergies  Allergen Reactions  . Penicillins Rash    Has patient had a PCN reaction causing immediate rash, facial/tongue/throat swelling, SOB or lightheadedness with hypotension: Yes Has patient had a PCN reaction causing severe rash involving mucus membranes or skin necrosis: No Has patient had a PCN reaction that required hospitalization: No Has patient had a PCN reaction occurring within the last 10 years: Yes If all  of the above answers are "NO", then may proceed with Cephalosporin use.     Medications Prior to Admission  Medication Sig Dispense Refill Last Dose  . acetaminophen (TYLENOL) 325 MG tablet Take 650 mg by mouth every 6 (six) hours as needed for mild pain or headache.   Not Taking  . albuterol (PROVENTIL HFA;VENTOLIN HFA) 108 (90 Base) MCG/ACT inhaler Inhale 1-2 puffs into the lungs every 6 (six) hours as needed for wheezing or shortness of breath.   Not Taking  . nicotine (NICODERM CQ - DOSED IN MG/24 HOURS) 14 mg/24hr patch Place 1 patch (14 mg total) onto the skin daily. 28 patch 0   . Pseudoeph-CPM-DM-APAP (TYLENOL COLD PO) Take 1 tablet by mouth 2 (two) times daily as needed (cold symptoms).   Not Taking    Review of Systems  HENT: Positive for nosebleeds and sinus pressure. Negative for congestion, ear pain and sore throat.   Eyes: Negative for visual disturbance.  Respiratory: Negative for cough, chest tightness and shortness of breath.   Cardiovascular: Negative for chest pain.  Gastrointestinal: Negative for abdominal pain.  Genitourinary: Negative for vaginal discharge.  Neurological: Positive for light-headedness and headaches. Negative for syncope.   Physical Exam   Blood pressure (!) 119/59, pulse 89, temperature 98.2 F (36.8 C), resp. rate 16, height 5\' 2"  (1.575 m), weight 98 kg, last menstrual period 02/22/2017, unknown if currently breastfeeding.  Physical Exam  Nursing note and vitals reviewed. Constitutional: She is oriented to person, place, and time. She appears well-developed and well-nourished. No distress.  HENT:  Head: Normocephalic and atraumatic.  Right Ear: Hearing, tympanic membrane, external ear and ear canal normal.  Left Ear: Hearing, tympanic membrane, external ear and ear canal normal.  Nose: No mucosal edema, rhinorrhea, nose lacerations or nasal deformity. No epistaxis. Right sinus exhibits no maxillary sinus tenderness and no frontal sinus  tenderness. Left sinus exhibits no maxillary sinus tenderness and no frontal sinus tenderness.  Mouth/Throat: Uvula is midline, oropharynx is clear and moist and mucous membranes are normal.  Neck: Normal range of motion.  Cardiovascular: Normal rate.  Respiratory: Effort normal. No respiratory distress.  Musculoskeletal: Normal range of motion.  Neurological: She is alert and oriented to person, place, and time.  Skin: Skin is warm and dry.  Psychiatric: She has a normal mood and affect.  EFM: 135 bpm, mod variability, + accels, no decels Toco: none  Results for orders placed or performed during the hospital encounter of 09/30/17 (from the past 24 hour(s))  Urinalysis, Routine w reflex microscopic     Status: Abnormal   Collection Time: 09/30/17  3:17 PM  Result Value Ref Range   Color, Urine YELLOW YELLOW   APPearance HAZY (A) CLEAR   Specific Gravity, Urine 1.008 1.005 - 1.030   pH 7.0 5.0 - 8.0   Glucose, UA NEGATIVE NEGATIVE mg/dL   Hgb urine dipstick NEGATIVE NEGATIVE   Bilirubin Urine NEGATIVE NEGATIVE   Ketones, ur NEGATIVE NEGATIVE mg/dL   Protein, ur NEGATIVE NEGATIVE mg/dL   Nitrite NEGATIVE NEGATIVE   Leukocytes, UA TRACE (A) NEGATIVE   RBC / HPF 0-5 0 - 5 RBC/hpf   WBC, UA 0-5 0 - 5 WBC/hpf   Bacteria, UA RARE (A) NONE SEEN   Squamous Epithelial / LPF 11-20 0 - 5  CBC     Status: Abnormal   Collection Time: 09/30/17  3:36 PM  Result Value Ref Range   WBC 7.5 4.0 - 10.5 K/uL   RBC 3.43 (L) 3.87 - 5.11 MIL/uL   Hemoglobin 10.1 (L) 12.0 - 15.0 g/dL   HCT 16.1 (L) 09.6 - 04.5 %   MCV 88.9 78.0 - 100.0 fL   MCH 29.4 26.0 - 34.0 pg   MCHC 33.1 30.0 - 36.0 g/dL   RDW 40.9 81.1 - 91.4 %   Platelets 246 150 - 400 K/uL   MAU Course  Procedures Fioricet  MDM Labs ordered and reviewed. Normal neuro exam. HA resolved after med. No nosebleed identified. Discussed blood can been seen after blowing nose, especially during pregnancy d/t hormonal effects. Recommend against  blowing nose, nose picking, or placing anything in nose. Increase water intake, reduce soda intake. Presentation, clinical findings, and plan discussed with Dr. Tenny Craw. Stable for discharge home.  Assessment and Plan   1. [redacted] weeks gestation of pregnancy   2. NST (non-stress test) reactive   3. Pregnancy headache in third trimester    Discharge home Follow up in OB office next week Rx Fioricet Increase to 6 bottles of water per day  Allergies as of 09/30/2017  Reactions   Penicillins Rash   Has patient had a PCN reaction causing immediate rash, facial/tongue/throat swelling, SOB or lightheadedness with hypotension: Yes Has patient had a PCN reaction causing severe rash involving mucus membranes or skin necrosis: No Has patient had a PCN reaction that required hospitalization: No Has patient had a PCN reaction occurring within the last 10 years: Yes If all of the above answers are "NO", then may proceed with Cephalosporin use.      Medication List    STOP taking these medications   acetaminophen 325 MG tablet Commonly known as:  TYLENOL   TYLENOL COLD PO     TAKE these medications   albuterol 108 (90 Base) MCG/ACT inhaler Commonly known as:  PROVENTIL HFA;VENTOLIN HFA Inhale 1-2 puffs into the lungs every 6 (six) hours as needed for wheezing or shortness of breath.   butalbital-acetaminophen-caffeine 50-325-40 MG tablet Commonly known as:  FIORICET, ESGIC Take 1 tablet by mouth every 4 (four) hours as needed for headache.   nicotine 14 mg/24hr patch Commonly known as:  NICODERM CQ - dosed in mg/24 hours Place 1 patch (14 mg total) onto the skin daily.      Donette Larry, CNM 09/30/2017, 4:42 PM

## 2017-10-06 ENCOUNTER — Other Ambulatory Visit: Payer: Self-pay | Admitting: Obstetrics and Gynecology

## 2017-10-08 ENCOUNTER — Ambulatory Visit: Payer: Medicaid Other | Admitting: Allergy & Immunology

## 2017-11-04 LAB — OB RESULTS CONSOLE GBS: STREP GROUP B AG: NEGATIVE

## 2017-11-11 ENCOUNTER — Encounter (HOSPITAL_COMMUNITY): Payer: Self-pay

## 2017-11-23 ENCOUNTER — Encounter (HOSPITAL_COMMUNITY): Payer: Self-pay | Admitting: *Deleted

## 2017-11-23 ENCOUNTER — Other Ambulatory Visit: Payer: Self-pay

## 2017-11-23 ENCOUNTER — Encounter (HOSPITAL_COMMUNITY)
Admission: AD | Disposition: A | Discharge status: Home / Self Care | Payer: Self-pay | Source: Home / Self Care | Attending: Obstetrics

## 2017-11-23 ENCOUNTER — Inpatient Hospital Stay (HOSPITAL_COMMUNITY)
Admission: AD | Admit: 2017-11-23 | Discharge: 2017-11-26 | DRG: 788 | Disposition: A | Discharge status: Home / Self Care | Payer: Medicaid Other | Attending: Obstetrics | Admitting: Obstetrics

## 2017-11-23 ENCOUNTER — Inpatient Hospital Stay (HOSPITAL_COMMUNITY): Payer: Medicaid Other | Admitting: Anesthesiology

## 2017-11-23 ENCOUNTER — Inpatient Hospital Stay (HOSPITAL_COMMUNITY)
Admission: RE | Admit: 2017-11-23 | Payer: Medicaid Other | Source: Ambulatory Visit | Admitting: Obstetrics and Gynecology

## 2017-11-23 DIAGNOSIS — O133 Gestational [pregnancy-induced] hypertension without significant proteinuria, third trimester: Secondary | ICD-10-CM

## 2017-11-23 DIAGNOSIS — O34211 Maternal care for low transverse scar from previous cesarean delivery: Secondary | ICD-10-CM | POA: Diagnosis present

## 2017-11-23 DIAGNOSIS — Z87891 Personal history of nicotine dependence: Secondary | ICD-10-CM

## 2017-11-23 DIAGNOSIS — Z880 Allergy status to penicillin: Secondary | ICD-10-CM

## 2017-11-23 DIAGNOSIS — O99214 Obesity complicating childbirth: Secondary | ICD-10-CM | POA: Diagnosis present

## 2017-11-23 DIAGNOSIS — O9952 Diseases of the respiratory system complicating childbirth: Secondary | ICD-10-CM | POA: Diagnosis present

## 2017-11-23 DIAGNOSIS — O34219 Maternal care for unspecified type scar from previous cesarean delivery: Secondary | ICD-10-CM | POA: Diagnosis present

## 2017-11-23 DIAGNOSIS — D649 Anemia, unspecified: Secondary | ICD-10-CM | POA: Diagnosis present

## 2017-11-23 DIAGNOSIS — O134 Gestational [pregnancy-induced] hypertension without significant proteinuria, complicating childbirth: Secondary | ICD-10-CM | POA: Diagnosis present

## 2017-11-23 DIAGNOSIS — O9902 Anemia complicating childbirth: Secondary | ICD-10-CM | POA: Diagnosis present

## 2017-11-23 DIAGNOSIS — J45909 Unspecified asthma, uncomplicated: Secondary | ICD-10-CM | POA: Diagnosis present

## 2017-11-23 DIAGNOSIS — Z3A39 39 weeks gestation of pregnancy: Secondary | ICD-10-CM

## 2017-11-23 LAB — CBC
HEMATOCRIT: 31.6 % — AB (ref 36.0–46.0)
HEMOGLOBIN: 10.5 g/dL — AB (ref 12.0–15.0)
MCH: 28.7 pg (ref 26.0–34.0)
MCHC: 33.2 g/dL (ref 30.0–36.0)
MCV: 86.3 fL (ref 80.0–100.0)
NRBC: 0 % (ref 0.0–0.2)
Platelets: 228 10*3/uL (ref 150–400)
RBC: 3.66 MIL/uL — ABNORMAL LOW (ref 3.87–5.11)
RDW: 14.8 % (ref 11.5–15.5)
WBC: 7.6 10*3/uL (ref 4.0–10.5)

## 2017-11-23 LAB — TYPE AND SCREEN
ABO/RH(D): O POS
ANTIBODY SCREEN: NEGATIVE

## 2017-11-23 LAB — COMPREHENSIVE METABOLIC PANEL
ALK PHOS: 115 U/L (ref 38–126)
ALT: 9 U/L (ref 0–44)
AST: 16 U/L (ref 15–41)
Albumin: 3 g/dL — ABNORMAL LOW (ref 3.5–5.0)
Anion gap: 10 (ref 5–15)
BILIRUBIN TOTAL: 0.3 mg/dL (ref 0.3–1.2)
BUN: <5 mg/dL — ABNORMAL LOW (ref 6–20)
CALCIUM: 8.7 mg/dL — AB (ref 8.9–10.3)
CO2: 20 mmol/L — ABNORMAL LOW (ref 22–32)
Chloride: 107 mmol/L (ref 98–111)
Creatinine, Ser: 0.47 mg/dL (ref 0.44–1.00)
GFR calc Af Amer: >60 mL/min (ref >60)
GLUCOSE: 106 mg/dL — AB (ref 70–99)
Potassium: 2.9 mmol/L — ABNORMAL LOW (ref 3.5–5.1)
Sodium: 137 mmol/L (ref 135–145)
TOTAL PROTEIN: 6.6 g/dL (ref 6.5–8.1)

## 2017-11-23 LAB — PROTEIN / CREATININE RATIO, URINE
Creatinine, Urine: 36 mg/dL
Total Protein, Urine: <6 mg/dL

## 2017-11-23 SURGERY — Surgical Case
Anesthesia: Spinal | Site: Abdomen | Wound class: Clean Contaminated

## 2017-11-23 MED ORDER — LACTATED RINGERS IV SOLN
INTRAVENOUS | Status: DC
  Administered 2017-11-23: 14:00:00 via INTRAVENOUS

## 2017-11-23 MED ORDER — KETOROLAC TROMETHAMINE 30 MG/ML IJ SOLN
INTRAMUSCULAR | Status: AC
  Filled 2017-11-23: qty 1

## 2017-11-23 MED ORDER — KETOROLAC TROMETHAMINE 30 MG/ML IJ SOLN
30.0000 mg | Freq: Four times a day (QID) | INTRAMUSCULAR | Status: AC | PRN
  Administered 2017-11-24 (×2): 30 mg via INTRAVENOUS
  Filled 2017-11-23 (×2): qty 1

## 2017-11-23 MED ORDER — FENTANYL CITRATE (PF) 100 MCG/2ML IJ SOLN
INTRAMUSCULAR | Status: AC
  Filled 2017-11-23: qty 2

## 2017-11-23 MED ORDER — DIPHENHYDRAMINE HCL 25 MG PO CAPS: 25.0000 mg | ORAL_CAPSULE | Freq: Four times a day (QID) | ORAL | Status: DC | PRN

## 2017-11-23 MED ORDER — CEFAZOLIN SODIUM-DEXTROSE 2-4 GM/100ML-% IV SOLN
2.0000 g | INTRAVENOUS | Status: AC
  Administered 2017-11-23: 2 g via INTRAVENOUS
  Filled 2017-11-23: qty 100

## 2017-11-23 MED ORDER — LACTATED RINGERS IV SOLN
INTRAVENOUS | Status: DC | PRN
  Administered 2017-11-23: 17:00:00 via INTRAVENOUS

## 2017-11-23 MED ORDER — NALOXONE HCL 4 MG/10ML IJ SOLN: 1.0000 ug/kg/h | INTRAVENOUS | Status: DC | PRN

## 2017-11-23 MED ORDER — ONDANSETRON HCL 4 MG/2ML IJ SOLN: 4.0000 mg | Freq: Three times a day (TID) | INTRAMUSCULAR | Status: DC | PRN

## 2017-11-23 MED ORDER — MEPERIDINE HCL 25 MG/ML IJ SOLN: 6.2500 mg | INTRAMUSCULAR | Status: DC | PRN

## 2017-11-23 MED ORDER — NALBUPHINE HCL 10 MG/ML IJ SOLN: 5.0000 mg | Freq: Once | INTRAMUSCULAR | Status: DC | PRN

## 2017-11-23 MED ORDER — SOD CITRATE-CITRIC ACID 500-334 MG/5ML PO SOLN
30.0000 mL | Freq: Once | ORAL | Status: AC
  Administered 2017-11-23: 30 mL via ORAL
  Filled 2017-11-23: qty 15

## 2017-11-23 MED ORDER — PHENYLEPHRINE 8 MG IN D5W 100 ML (0.08MG/ML) PREMIX OPTIME
INJECTION | INTRAVENOUS | Status: DC | PRN
  Administered 2017-11-23: 60 ug/min via INTRAVENOUS

## 2017-11-23 MED ORDER — OXYTOCIN 10 UNIT/ML IJ SOLN
INTRAMUSCULAR | Status: AC
  Filled 2017-11-23: qty 4

## 2017-11-23 MED ORDER — NALBUPHINE HCL 10 MG/ML IJ SOLN: 5.0000 mg | INTRAMUSCULAR | Status: DC | PRN

## 2017-11-23 MED ORDER — SIMETHICONE 80 MG PO CHEW
80.0000 mg | CHEWABLE_TABLET | ORAL | Status: DC | PRN
  Filled 2017-11-23: qty 1

## 2017-11-23 MED ORDER — SIMETHICONE 80 MG PO CHEW
80.0000 mg | CHEWABLE_TABLET | ORAL | Status: DC
  Administered 2017-11-24 – 2017-11-25 (×3): 80 mg via ORAL
  Filled 2017-11-23 (×3): qty 1

## 2017-11-23 MED ORDER — FENTANYL CITRATE (PF) 100 MCG/2ML IJ SOLN
INTRAMUSCULAR | Status: DC | PRN
  Administered 2017-11-23: 15 ug via INTRATHECAL

## 2017-11-23 MED ORDER — MORPHINE SULFATE (PF) 0.5 MG/ML IJ SOLN
INTRAMUSCULAR | Status: AC
  Filled 2017-11-23: qty 10

## 2017-11-23 MED ORDER — OXYTOCIN 40 UNITS IN LACTATED RINGERS INFUSION - SIMPLE MED: 2.5000 [IU]/h | INTRAVENOUS | Status: DC

## 2017-11-23 MED ORDER — NALBUPHINE HCL 10 MG/ML IJ SOLN
5.0000 mg | INTRAMUSCULAR | Status: DC | PRN
  Administered 2017-11-24: 5 mg via INTRAVENOUS
  Filled 2017-11-23 (×2): qty 1

## 2017-11-23 MED ORDER — FAMOTIDINE IN NACL 20-0.9 MG/50ML-% IV SOLN
20.0000 mg | Freq: Once | INTRAVENOUS | Status: AC
  Administered 2017-11-23: 20 mg via INTRAVENOUS
  Filled 2017-11-23: qty 50

## 2017-11-23 MED ORDER — SCOPOLAMINE 1 MG/3DAYS TD PT72
1.0000 | MEDICATED_PATCH | Freq: Once | TRANSDERMAL | Status: DC
  Administered 2017-11-23: 1.5 mg via TRANSDERMAL

## 2017-11-23 MED ORDER — OXYTOCIN 10 UNIT/ML IJ SOLN
INTRAVENOUS | Status: DC | PRN
  Administered 2017-11-23: 40 [IU] via INTRAVENOUS

## 2017-11-23 MED ORDER — MENTHOL 3 MG MT LOZG: 1.0000 | LOZENGE | OROMUCOSAL | Status: DC | PRN

## 2017-11-23 MED ORDER — ACETAMINOPHEN 325 MG PO TABS
650.0000 mg | ORAL_TABLET | ORAL | Status: DC | PRN
  Administered 2017-11-23 – 2017-11-26 (×8): 650 mg via ORAL
  Filled 2017-11-23 (×8): qty 2

## 2017-11-23 MED ORDER — OXYCODONE HCL 5 MG PO TABS
10.0000 mg | ORAL_TABLET | ORAL | Status: DC | PRN
  Administered 2017-11-25: 10 mg via ORAL

## 2017-11-23 MED ORDER — ACETAMINOPHEN 500 MG PO TABS
1000.0000 mg | ORAL_TABLET | Freq: Once | ORAL | Status: AC
  Administered 2017-11-23: 1000 mg via ORAL
  Filled 2017-11-23: qty 2

## 2017-11-23 MED ORDER — SENNOSIDES-DOCUSATE SODIUM 8.6-50 MG PO TABS
2.0000 | ORAL_TABLET | ORAL | Status: DC
  Administered 2017-11-24 – 2017-11-25 (×3): 2 via ORAL
  Filled 2017-11-23 (×3): qty 2

## 2017-11-23 MED ORDER — SCOPOLAMINE 1 MG/3DAYS TD PT72
MEDICATED_PATCH | TRANSDERMAL | Status: AC
  Filled 2017-11-23: qty 1

## 2017-11-23 MED ORDER — OXYCODONE HCL 5 MG PO TABS
5.0000 mg | ORAL_TABLET | ORAL | Status: DC | PRN
  Administered 2017-11-24 – 2017-11-26 (×8): 5 mg via ORAL
  Filled 2017-11-23 (×10): qty 1

## 2017-11-23 MED ORDER — KETOROLAC TROMETHAMINE 30 MG/ML IJ SOLN
30.0000 mg | Freq: Four times a day (QID) | INTRAMUSCULAR | Status: AC | PRN
  Administered 2017-11-23: 30 mg via INTRAMUSCULAR

## 2017-11-23 MED ORDER — MORPHINE SULFATE (PF) 0.5 MG/ML IJ SOLN
INTRAMUSCULAR | Status: DC | PRN
  Administered 2017-11-23: .15 mg via INTRATHECAL

## 2017-11-23 MED ORDER — SODIUM CHLORIDE 0.9 % IR SOLN
Status: DC | PRN
  Administered 2017-11-23: 1000 mL

## 2017-11-23 MED ORDER — DIBUCAINE 1 % RE OINT: 1.0000 "application " | TOPICAL_OINTMENT | RECTAL | Status: DC | PRN

## 2017-11-23 MED ORDER — ONDANSETRON HCL 4 MG/2ML IJ SOLN
INTRAMUSCULAR | Status: DC | PRN
  Administered 2017-11-23: 4 mg via INTRAVENOUS

## 2017-11-23 MED ORDER — LACTATED RINGERS IV BOLUS
1000.0000 mL | Freq: Once | INTRAVENOUS | Status: AC
  Administered 2017-11-23: 1000 mL via INTRAVENOUS

## 2017-11-23 MED ORDER — SODIUM CHLORIDE 0.9% FLUSH: 3.0000 mL | INTRAVENOUS | Status: DC | PRN

## 2017-11-23 MED ORDER — WITCH HAZEL-GLYCERIN EX PADS: 1.0000 "application " | MEDICATED_PAD | CUTANEOUS | Status: DC | PRN

## 2017-11-23 MED ORDER — DIPHENHYDRAMINE HCL 50 MG/ML IJ SOLN
12.5000 mg | INTRAMUSCULAR | Status: DC | PRN
  Administered 2017-11-23: 12.5 mg via INTRAVENOUS
  Filled 2017-11-23 (×2): qty 1

## 2017-11-23 MED ORDER — BUPIVACAINE IN DEXTROSE 0.75-8.25 % IT SOLN
INTRATHECAL | Status: DC | PRN
  Administered 2017-11-23: 1.6 mL via INTRATHECAL

## 2017-11-23 MED ORDER — NALOXONE HCL 0.4 MG/ML IJ SOLN: 0.4000 mg | INTRAMUSCULAR | Status: DC | PRN

## 2017-11-23 MED ORDER — PRENATAL MULTIVITAMIN CH
1.0000 | ORAL_TABLET | Freq: Every day | ORAL | Status: DC
  Administered 2017-11-24 – 2017-11-26 (×3): 1 via ORAL
  Filled 2017-11-23 (×3): qty 1

## 2017-11-23 MED ORDER — ONDANSETRON HCL 4 MG/2ML IJ SOLN
INTRAMUSCULAR | Status: AC
  Filled 2017-11-23: qty 2

## 2017-11-23 MED ORDER — PHENYLEPHRINE 8 MG IN D5W 100 ML (0.08MG/ML) PREMIX OPTIME
INJECTION | INTRAVENOUS | Status: AC
  Filled 2017-11-23: qty 100

## 2017-11-23 MED ORDER — FENTANYL CITRATE (PF) 100 MCG/2ML IJ SOLN: 25.0000 ug | INTRAMUSCULAR | Status: DC | PRN

## 2017-11-23 MED ORDER — STERILE WATER FOR IRRIGATION IR SOLN
Status: DC | PRN
  Administered 2017-11-23: 1000 mL

## 2017-11-23 MED ORDER — LACTATED RINGERS IV SOLN
INTRAVENOUS | Status: DC
  Administered 2017-11-23 – 2017-11-24 (×2): via INTRAVENOUS

## 2017-11-23 MED ORDER — COCONUT OIL OIL
1.0000 "application " | TOPICAL_OIL | Status: DC | PRN
  Administered 2017-11-25: 1 via TOPICAL
  Filled 2017-11-23: qty 120

## 2017-11-23 MED ORDER — TETANUS-DIPHTH-ACELL PERTUSSIS 5-2.5-18.5 LF-MCG/0.5 IM SUSP: 0.5000 mL | Freq: Once | INTRAMUSCULAR | Status: DC

## 2017-11-23 MED ORDER — IBUPROFEN 600 MG PO TABS
600.0000 mg | ORAL_TABLET | Freq: Four times a day (QID) | ORAL | Status: DC
  Administered 2017-11-24 – 2017-11-26 (×8): 600 mg via ORAL
  Filled 2017-11-23 (×9): qty 1

## 2017-11-23 MED ORDER — DIPHENHYDRAMINE HCL 25 MG PO CAPS
25.0000 mg | ORAL_CAPSULE | ORAL | Status: DC | PRN
  Administered 2017-11-24: 25 mg via ORAL
  Filled 2017-11-23: qty 1

## 2017-11-23 MED ORDER — SIMETHICONE 80 MG PO CHEW
80.0000 mg | CHEWABLE_TABLET | Freq: Three times a day (TID) | ORAL | Status: DC
  Administered 2017-11-24 – 2017-11-26 (×7): 80 mg via ORAL
  Filled 2017-11-23 (×6): qty 1

## 2017-11-23 SURGICAL SUPPLY — 35 items
BENZOIN TINCTURE PRP APPL 2/3 (GAUZE/BANDAGES/DRESSINGS) ×3 IMPLANT
CHLORAPREP W/TINT 26ML (MISCELLANEOUS) ×3 IMPLANT
CLAMP CORD UMBIL (MISCELLANEOUS) ×3 IMPLANT
CLOSURE WOUND 1/2 X4 (GAUZE/BANDAGES/DRESSINGS) ×1
CLOTH BEACON ORANGE TIMEOUT ST (SAFETY) ×3 IMPLANT
DRSG OPSITE POSTOP 4X10 (GAUZE/BANDAGES/DRESSINGS) ×3 IMPLANT
ELECT REM PT RETURN 9FT ADLT (ELECTROSURGICAL) ×3
ELECTRODE REM PT RTRN 9FT ADLT (ELECTROSURGICAL) ×1 IMPLANT
EXTENDER TRAXI PANNICULUS (MISCELLANEOUS) ×1 IMPLANT
GLOVE BIOGEL PI IND STRL 6.5 (GLOVE) ×1 IMPLANT
GLOVE BIOGEL PI IND STRL 7.0 (GLOVE) ×1 IMPLANT
GLOVE BIOGEL PI INDICATOR 6.5 (GLOVE) ×2
GLOVE BIOGEL PI INDICATOR 7.0 (GLOVE) ×2
GLOVE ECLIPSE 6.0 STRL STRAW (GLOVE) ×3 IMPLANT
GOWN STRL REUS W/TWL LRG LVL3 (GOWN DISPOSABLE) ×6 IMPLANT
NS IRRIG 1000ML POUR BTL (IV SOLUTION) ×3 IMPLANT
PACK C SECTION WH (CUSTOM PROCEDURE TRAY) ×3 IMPLANT
PAD OB MATERNITY 4.3X12.25 (PERSONAL CARE ITEMS) ×3 IMPLANT
PENCIL SMOKE EVAC W/HOLSTER (ELECTROSURGICAL) ×3 IMPLANT
RETAINER VISCERAL (MISCELLANEOUS) ×3 IMPLANT
RTRCTR C-SECT PINK 25CM LRG (MISCELLANEOUS) ×3 IMPLANT
SPONGE LAP 18X18 RF (DISPOSABLE) ×9 IMPLANT
STRIP CLOSURE SKIN 1/2X4 (GAUZE/BANDAGES/DRESSINGS) ×2 IMPLANT
SUT MNCRL 0 VIOLET CTX 36 (SUTURE) ×2 IMPLANT
SUT MNCRL AB 3-0 PS2 27 (SUTURE) ×3 IMPLANT
SUT MONOCRYL 0 CTX 36 (SUTURE) ×4
SUT PLAIN 2 0 (SUTURE) ×2
SUT PLAIN ABS 2-0 CT1 27XMFL (SUTURE) ×1 IMPLANT
SUT VIC AB 0 CTX 36 (SUTURE) ×4
SUT VIC AB 0 CTX36XBRD ANBCTRL (SUTURE) ×2 IMPLANT
SUT VIC AB 2-0 CT1 27 (SUTURE) ×2
SUT VIC AB 2-0 CT1 TAPERPNT 27 (SUTURE) ×1 IMPLANT
TOWEL OR 17X24 6PK STRL BLUE (TOWEL DISPOSABLE) ×3 IMPLANT
TRAXI PANNICULUS EXTENDER (MISCELLANEOUS) ×2
TRAY FOLEY W/BAG SLVR 14FR LF (SET/KITS/TRAYS/PACK) ×3 IMPLANT

## 2017-11-23 NOTE — MAU Note (Signed)
Sent from office for pre-e  eval.  BP was a little off, no prior hx.  +HA, been having them. Vision normal now- but was blurring on left side earlier this morning. Has been having pain in upper abd, comes and goes.  Some increase in swelling. Denies bleeding or leaking.

## 2017-11-23 NOTE — Transfer of Care (Signed)
Immediate Anesthesia Transfer of Care Note  Patient: Meagan Ross  Procedure(s) Performed: REPEAT CESAREAN SECTION (N/A Abdomen)  Patient Location: PACU  Anesthesia Type:Spinal  Level of Consciousness: awake, alert  and oriented  Airway & Oxygen Therapy: Patient Spontanous Breathing  Post-op Assessment: Report given to RN and Post -op Vital signs reviewed and stable  Post vital signs: Reviewed and stable  Last Vitals:  Vitals Value Taken Time  BP    Temp    Pulse    Resp    SpO2      Last Pain:  Vitals:   11/23/17 1215  TempSrc:   PainSc: 0-No pain         Complications: No apparent anesthesia complications

## 2017-11-23 NOTE — Patient Instructions (Signed)
Meagan Ross  11/23/2017   Your procedure is scheduled on:  11/26/2017  Enter through the Main Entrance of [facility] at 0530 AM.  Pick up the phone at the desk and dial [phone]  Call this number if you have problems the morning of surgery:[phone]  Remember:   Do not eat food:(After Midnight) Desps de medianoche.  Do not drink clear liquids: (After Midnight) Desps de medianoche.  Take these medicines the morning of surgery with A SIP OF WATER: none   Do not wear jewelry, make-up or nail polish.  Do not wear lotions, powders, or perfumes. Do not wear deodorant.  Do not shave 48 hours prior to surgery.  Do not bring valuables to the hospital.  [facility] is not   responsible for any belongings or valuables brought to the hospital.  Contacts, dentures or bridgework may not be worn into surgery.  Leave suitcase in the car. After surgery it may be brought to your room.  For patients admitted to the hospital, checkout time is 11:00 AM the day of              discharge.    N/A   Please read over the following fact sheets that you were given:   Surgical Site Infection Prevention

## 2017-11-23 NOTE — MAU Provider Note (Addendum)
History     CSN: [id number]  Arrival date and time: 11/23/17 0941   First Provider Initiated Contact with Patient 11/23/17 1030      Chief Complaint  Patient presents with  . Headache  . Hypertension   HPI  Ms.  Meagan Ross is a 27 y.o. year old [id number] female at [redacted]w[redacted]d weeks gestation who was sent to MAU for PEC evaluation. She was seen for an OB visit by Dr. Dareen Piano today. Her BP in the office was 150/72 and 159/70. She also complains of a LT sided H/A, LT eye blurry vision and frequent nosebleeds (last one last night). She reports some intermittent upper abdominal pain, ? UCs and increased swelling in both hands. She denies VB or LOF. She report good (+) FM today. Scheduled for repeat LTCS on Thursday 11/26/2017.  *Last ate a muffin @ 0830  Past Medical History:  Diagnosis Date  . Asthma   . Bronchitis   . Headache     Past Surgical History:  Procedure Laterality Date  . CESAREAN SECTION     breech and 2nd for RC/S  . NOSE SURGERY      Family History  Problem Relation Age of Onset  . Hypertension Maternal Grandmother     Social History   Tobacco Use  . Smoking status: Former Smoker    Packs/day: 0.50    Years: 5.00    Pack years: 2.50    Types: Cigarettes    Last attempt to quit: 03/14/2017    Years since quitting: 0.6  . Smokeless tobacco: Never Used  . Tobacco comment: states "need to"  Substance Use Topics  . Alcohol use: No    Frequency: Never  . Drug use: No    Allergies:  Allergies  Allergen Reactions  . Penicillins Rash and Other (See Comments)    Has patient had a PCN reaction causing immediate rash, facial/tongue/throat swelling, SOB or lightheadedness with hypotension: Yes Has patient had a PCN reaction causing severe rash involving mucus membranes or skin necrosis: No Has patient had a PCN reaction that required hospitalization: No Has patient had a PCN reaction occurring within the last 10 years: Yes If all of the above answers  are "NO", then may proceed with Cephalosporin use.     Medications Prior to Admission  Medication Sig Dispense Refill Last Dose  . FEROSUL 325 (65 Fe) MG tablet Take 325 mg by mouth daily.  3 11/22/2017 at Unknown time  . Prenat-FeAsp-Meth-FA-DHA w/o A (PRENATE PIXIE) 10-0.6-0.4-200 MG CAPS Take 1 capsule by mouth daily.  3 11/22/2017 at Unknown time  . butalbital-acetaminophen-caffeine (FIORICET, ESGIC) 50-325-40 MG tablet Take 1 tablet by mouth every 4 (four) hours as needed for headache. (Patient not taking: Reported on 11/18/2017) 10 tablet 0 Not Taking at Unknown time  . nicotine (NICODERM CQ - DOSED IN MG/24 HOURS) 14 mg/24hr patch Place 1 patch (14 mg total) onto the skin daily. (Patient not taking: Reported on 11/18/2017) 28 patch 0 Not Taking at Unknown time    Review of Systems  Constitutional: Negative.   HENT: Positive for nosebleeds ("frequent"; last one was ).   Eyes: Positive for visual disturbance (intermittent blurriness in LT).  Respiratory: Negative.   Cardiovascular: Positive for leg swelling.  Gastrointestinal: Positive for abdominal pain (upper).  Endocrine: Negative.   Genitourinary: Negative.   Musculoskeletal: Negative.   Skin: Negative.   Allergic/Immunologic: Negative.   Neurological: Positive for light-headedness and headaches.  Hematological: Negative.   Psychiatric/Behavioral: Negative.  Physical Exam   Patient Vitals for the past 24 hrs:  BP Temp Temp src Pulse Resp SpO2 Weight  11/23/17 1216 (!) 129/58 - - 79 - - -  11/23/17 1207 137/65 - - 88 - - -  11/23/17 1201 138/66 - - 77 - - -  11/23/17 1146 (!) 142/66 - - 79 - - -  11/23/17 1131 139/69 - - 83 - - -  11/23/17 1101 133/69 - - 76 - - -  11/23/17 1046 134/67 - - 83 - - -  11/23/17 1031 122/68 - - (!) 102 - - -  11/23/17 1024 (!) 127/55 - - 81 - - -  11/23/17 0954 (!) 127/55 98.2 F (36.8 C) Oral 83 18 99 % 100.7 kg    Physical Exam  Nursing note and vitals reviewed. Constitutional:  She is oriented to person, place, and time. She appears well-developed and well-nourished.  HENT:  Head: Normocephalic and atraumatic.  Right Ear: External ear normal.  Left Ear: External ear normal.  Nose: Nose normal.  Mouth/Throat: Oropharynx is clear and moist.  Eyes: Pupils are equal, round, and reactive to light. Conjunctivae and EOM are normal.  Neck: Normal range of motion. Neck supple.  Cardiovascular: Normal rate, regular rhythm, normal heart sounds and intact distal pulses.  Respiratory: Effort normal and breath sounds normal.  GI: Soft. Bowel sounds are normal.  Genitourinary:  Genitourinary Comments: Dilation: Closed Effacement (%): Thick Cervical Position: Posterior Station: Ballotable Presentation: Vertex Exam by: Carloyn Jaeger, CNM   Musculoskeletal: Normal range of motion.  Neurological: She is alert and oriented to person, place, and time. She has normal reflexes.  Skin: Skin is warm and dry.  Psychiatric: She has a normal mood and affect. Her behavior is normal. Judgment and thought content normal.    MAU Course  Procedures  MDM CCUA CBC CMP P/C Ratio Tylenol 1000 mg po -- pain 5/10 prior to medication // pain 1/10 after medication  *Consult with Dr. Debroah Loop @ 1230 - notified of patient's complaints, assessments, lab & NST results, recommended tx plan delivery d/t gHTN w/ severe features, call Dr. Chestine Spore to inform of recommendation  *Consult with Dr. Chestine Spore @ 1237 - notified of patient's complaints, assessments, lab, NST results, and Dr. Olivia Mackie recommendation for delivery -- agree with plan will prep for OR, will come see pt    Results for orders placed or performed during the hospital encounter of 11/23/17 (from the past 24 hour(s))  Protein / creatinine ratio, urine     Status: None   Collection Time: 11/23/17 10:07 AM  Result Value Ref Range   Creatinine, Urine 36.00 mg/dL   Total Protein, Urine <6 mg/dL   Protein Creatinine Ratio RESULT BELOW REPORTABLE  RANGE,  UNABLE TO CALCULATE.   0.00 - 0.15 mg/mg[Cre]  CBC     Status: Abnormal   Collection Time: 11/23/17 10:11 AM  Result Value Ref Range   WBC 7.6 4.0 - 10.5 K/uL   RBC 3.66 (L) 3.87 - 5.11 MIL/uL   Hemoglobin 10.5 (L) 12.0 - 15.0 g/dL   HCT 09.8 (L) 11.9 - 14.7 %   MCV 86.3 80.0 - 100.0 fL   MCH 28.7 26.0 - 34.0 pg   MCHC 33.2 30.0 - 36.0 g/dL   RDW 82.9 56.2 - 13.0 %   Platelets 228 150 - 400 K/uL   nRBC 0.0 0.0 - 0.2 %  Comprehensive metabolic panel     Status: Abnormal   Collection Time: 11/23/17 10:11 AM  Result Value Ref Range   Sodium 137 135 - 145 mmol/L   Potassium 2.9 (L) 3.5 - 5.1 mmol/L   Chloride 107 98 - 111 mmol/L   CO2 20 (L) 22 - 32 mmol/L   Glucose, Bld 106 (H) 70 - 99 mg/dL   BUN <5 (L) 6 - 20 mg/dL   Creatinine, Ser 9.56 0.44 - 1.00 mg/dL   Calcium 8.7 (L) 8.9 - 10.3 mg/dL   Total Protein 6.6 6.5 - 8.1 g/dL   Albumin 3.0 (L) 3.5 - 5.0 g/dL   AST 16 15 - 41 U/L   ALT 9 0 - 44 U/L   Alkaline Phosphatase 115 38 - 126 U/L   Total Bilirubin 0.3 0.3 - 1.2 mg/dL   GFR calc non Af Amer >60 >60 mL/min   GFR calc Af Amer >60 >60 mL/min   Anion gap 10 5 - 15    Assessment and Plan  1. Previous cesarean delivery affecting pregnancy  2. Gestational hypertension without significant proteinuria during pregnancy in     third trimester, antepartum - Admit to L&D - Care assumed by Dr. Chestine Spore at this time - See Dr. Ophelia Charter documentation for H&P   Raelyn Mora, MSN, CNM 11/23/2017, 10:30 AM

## 2017-11-23 NOTE — Brief Op Note (Signed)
11/23/2017  6:08 PM  PATIENT:  Meagan Ross  27 y.o. female  PRE-OPERATIVE DIAGNOSIS:  REPEAT EDD 11/29/17 PCN ALLERGY  POST-OPERATIVE DIAGNOSIS:  REPEAT  PROCEDURE:  Procedure(s): REPEAT CESAREAN SECTION (N/A)  SURGEON:  Surgeon(s) and Role:    Marlow Baars, MD - Primary  ANESTHESIA:   spinal  EBL:  300 mL   BLOOD ADMINISTERED:none  DRAINS: none   LOCAL MEDICATIONS USED:  NONE  SPECIMEN:  No Specimen  DISPOSITION OF SPECIMEN:  N/A  COUNTS:  YES  TOURNIQUET:  * No tourniquets in log *  DICTATION: .Note written in EPIC  PLAN OF CARE: Admit to inpatient   PATIENT DISPOSITION:  PACU - hemodynamically stable.   Delay start of Pharmacological VTE agent (>24hrs) due to surgical blood loss or risk of bleeding: not applicable

## 2017-11-23 NOTE — MAU Note (Signed)
Dr. Chestine Spore in to discuss plan of care with pt.

## 2017-11-23 NOTE — Anesthesia Preprocedure Evaluation (Signed)
Anesthesia Evaluation  Patient identified by MRN, date of birth, ID band Patient awake    Reviewed: Allergy & Precautions, NPO status , Patient's Chart, lab work & pertinent test results  Airway Mallampati: II  TM Distance: >3 FB Neck ROM: Full    Dental no notable dental hx. (+) Teeth Intact   Pulmonary asthma , former smoker,    Pulmonary exam normal breath sounds clear to auscultation       Cardiovascular hypertension, Normal cardiovascular exam Rhythm:Regular Rate:Normal  Gestational HTN   Neuro/Psych  Headaches, negative psych ROS   GI/Hepatic Neg liver ROS, GERD  ,  Endo/Other  Morbid obesity  Renal/GU Hypokalemia  negative genitourinary   Musculoskeletal negative musculoskeletal ROS (+)   Abdominal (+) + obese,   Peds  Hematology  (+) anemia ,   Anesthesia Other Findings   Reproductive/Obstetrics (+) Pregnancy Previous C/Section x 2                             Anesthesia Physical Anesthesia Plan  ASA: III and emergent  Anesthesia Plan: Spinal   Post-op Pain Management:    Induction:   PONV Risk Score and Plan: 4 or greater and Scopolamine patch - Pre-op, Ondansetron, Dexamethasone and Treatment may vary due to age or medical condition  Airway Management Planned: Natural Airway  Additional Equipment:   Intra-op Plan:   Post-operative Plan:   Informed Consent: I have reviewed the patients History and Physical, chart, labs and discussed the procedure including the risks, benefits and alternatives for the proposed anesthesia with the patient or authorized representative who has indicated his/her understanding and acceptance.   Dental advisory given  Plan Discussed with: Surgeon  Anesthesia Plan Comments:         Anesthesia Quick Evaluation

## 2017-11-23 NOTE — Anesthesia Procedure Notes (Signed)
Spinal  Patient location during procedure: OR Start time: 11/23/2017 4:40 PM End time: 11/23/2017 4:52 PM Staffing Anesthesiologist: Mal Amabile, MD Performed: anesthesiologist  Preanesthetic Checklist Completed: patient identified, site marked, surgical consent, pre-op evaluation, timeout performed, IV checked, risks and benefits discussed and monitors and equipment checked Spinal Block Patient position: sitting Prep: site prepped and draped and DuraPrep Patient monitoring: heart rate, cardiac monitor, continuous pulse ox and blood pressure Approach: midline Location: L3-4 Injection technique: single-shot Needle Needle type: Sprotte and Spinocan  Needle gauge: 25 G Needle length: 9 cm Needle insertion depth: 8 cm Assessment Sensory level: T4 Additional Notes Attempts x 3 with 24 Ga Pencan unsuccessful. Epidural spaced ID'd using 17 Ga Touhy needle LOR with air. No heme, CSF or paresthesias. SAB performed through Touhy needle. Needles withdrawn. Patient tolerated procedure well. Adequate sensory level.

## 2017-11-23 NOTE — H&P (Signed)
27 y.o. [id number] @ [redacted]w[redacted]d presents from the office with newly elevated blood pressure.  She presented for a routine office visit and was found to have a BP 150/78 with a headache.  In MAU, most BPs were normal, but a repeat BP approximately 4 hours after her office visit was 142/66.  Her headache resolved with tylenol.  Given GA [redacted] weeks and two elevated BPs 4 hours apart, she meets the criteria for gestational hypertension.  Labs were otherwise normal.  She has a history of cesarean section x 2.  She is contracting q4-5 minutes on toco, but not painful. Otherwise has good fetal movement and no bleeding.  Pregnancy c/b: 1. Asthma: was on advair mid-pregnancy, but discontinued.  Has not used albuterol in >3-4 weeks.    Past Medical History:  Diagnosis Date  . Asthma   . Bronchitis   . Headache     Past Surgical History:  Procedure Laterality Date  . CESAREAN SECTION     breech and 2nd for RC/S  . NOSE SURGERY      OB History  Gravida Para Term Preterm AB Living  5 2 2   2 2   SAB TAB Ectopic Multiple Live Births  1 1     2     # Outcome Date GA Lbr Len/2nd Weight Sex Delivery Anes PTL Lv  5 Current           4 TAB 2016          3 Term 2014 [redacted]w[redacted]d  3827 g M CS-Unspec Spinal N LIV     Birth Comments: no complications  2 SAB 2013          1 Term 2011   3629 g F CS-LTranv Spinal N LIV     Birth Comments: c/s due to breech    Social History   Socioeconomic History  . Marital status: Single    Spouse name: Not on file  . Number of children: Not on file  . Years of education: Not on file  . Highest education level: Not on file  Occupational History  . Not on file  Social Needs  . Financial resource strain: Not hard at all  . Food insecurity:    Worry: Never true    Inability: Never true  . Transportation needs:    Medical: No    Non-medical: Not on file  Tobacco Use  . Smoking status: Former Smoker    Packs/day: 0.50    Years: 5.00    Pack years: 2.50    Types: Cigarettes    Last attempt to quit: 03/14/2017    Years since quitting: 0.6  . Smokeless tobacco: Never Used  . Tobacco comment: states "need to"  Substance and Sexual Activity  . Alcohol use: No    Frequency: Never  . Drug use: No  . Sexual activity: Yes    Birth control/protection: None   Penicillins    Prenatal Transfer Tool  Maternal Diabetes: No Genetic Screening: Normal--NIPT low risk Maternal Ultrasounds/Referrals: Normal Fetal Ultrasounds or other Referrals:  None Maternal Substance Abuse:  No Significant Maternal Medications:  Meds include: Other: albuterol, iron Significant Maternal Lab Results: None  ABO, Rh: --/--/O POS (10/28 1315) Antibody: NEG (10/28 1315) Rubella: Immune (05/13 0000) RPR: Nonreactive (05/13 0000)  HBsAg: Negative (05/13 0000)  HIV: Non-reactive (05/13 0000)  GBS: Negative (10/09 0000)       Vitals:   11/23/17 1207 11/23/17 1216  BP: 137/65 (!) 129/58  Pulse: 88  79  Resp:    Temp:    SpO2:       General:  NAD Abdomen:  soft, gravid SVE: closed / thick / posterior per CNM Ex:  trace edema FHTs:  120s, mod var, + accels, cat 1 Toco: q4-5 min    A/P   27 y.o. [id number] [redacted]w[redacted]d presents for repeat cesarean section for gestational hypertension at term As mild headache resolved with tylenol, do not feel this is consistent with severe preeclampsia by features.  Will trat as gestational hypertension and deliver today, but will monitor closely for development of severe preeclampsia.  History of c/s x 2--plan repeat.  Discussed risks of cesarean section to include, but not limited to, infection, bleeding, damage to surrounding strutcures (including bowel, bladder, tubes, ovaries, nerves, vessels, baby), need for additional procedures, risk of blood clot, need for transfusion. Consent signed PCN allergy---mild rash on hands as child.  Denies hives, facial / tongue swelling, angioedema.  OK for ancef 2gm on call to OR  [facility] GEFFEL [place]

## 2017-11-23 NOTE — Consult Note (Signed)
Neonatology Note:   Attendance at C-section:    I was asked by Dr. Clark to attend this repeat C/S at term. The mother is a G5P2A2 O pos, GBS neg with elevated BP and obesity. ROM at delivery, fluid clear. Infant vigorous with good spontaneous cry and tone. Delayed cord clamping was done. Needed repeated bulb suctioning due to large amounts of clear secretions. Remained dusky at 4-5 minutes, so DeLee suctioning was done with BBO2, getting 6 ml clear fluid out. We monitored with pulse oximetry and O2 sats came up into the 90s slowly with 100% BBO2. We weaned the FIO2 gradually and finally had her weaned to room air by 15-16 minutes. Ap 8/8. Lungs clear to ausc with good air exchange in DR. Infant is able to remain with her parents for skin to skin time under nursing supervision. I spoke with the parents and with the supervising nurse, with plans to keep a portable sat monitor on the baby for a few more minutes until sure she does well with skin to skin time. I instructed her nurse to call me for any further problems. Transferred to the care of Pediatrician.   Meagan Ross C. Cresta Riden, MD 

## 2017-11-23 NOTE — Op Note (Signed)
Cesarean Section Procedure Note  Pre-operative Diagnosis: 1. Intrauterine pregnancy at [redacted]w[redacted]d  2. History of cesarean section x 2 3.  Gestational hypertension  Post-operative Diagnosis: same as above  Surgeon: Marlow Baars, MD  Procedure: Repeat low transverse cesarean section   Anesthesia: Spinal anesthesia  Estimated Blood Loss: 300 mL         Drains: Foley catheter                  Complications:  None; patient tolerated the procedure well.         Disposition: PACU - hemodynamically stable.  Findings:  Normal uterus, tubes and ovaries bilaterally.  Viable female infant, 3220g (7lb 1.6oz) Apgars 8, 8.    Procedure Details   After epidural anesthesia was found to adequate , the patient was placed in the dorsal supine position with a leftward tilt, prepped and draped in the usual sterile manner. A Pfannenstiel incision was made and carried down through the subcutaneous tissue to the fascia.  The fascia was incised in the midline and the fascial incision was extended laterally with Mayo scissors. The superior aspect of the fascial incision was grasped with two Kocher clamp, tented up and the rectus muscles dissected off sharply. The rectus was then dissected off with blunt dissection and Mayo scissors inferiorly. There were moderate adhesions of the rectus muscles to the fascia.  Sharp dissection was used to identify and separate the muscles in the midline. The abdominal peritoneum was identified, tented up, entered sharply, and the incision was extended superiorly and inferiorly with good visualization of the bladder. The Alexis retractor was deployed. The vesicouterine peritoneum was identified, tented up, entered sharply, and the bladder flap was created digitally. A scalpel was then used to make a low transverse incision on the uterus which was extended in the cephalad-caudad direction with blunt dissection. The fluid was clear. The fetal vertex was identified, elevated out of the pelvis  and brought to the hysterotomy.  The head was delivered easily followed by the shoulders and body.  After a 60 second delay per protocol, the cord was clamped and cut and the infant was passed to the waiting neonatologist.  The placenta was then delivered spontaneously, intact and appear normal, the uterus was cleared of all clot and debris   The hysterotomy was repaired with #0 Monocryl in running locked fashion.  A second imbricating layer of #0 Monocryl was placed.  A figure of eight suture was placed at the midportion of the hysterotomy, and excellent hemostasis was noted.  The Alexis retractor was removed from the abdomen. The peritoneum was examined and all vessels noted to be hemostatic. The abdominal cavity was cleared of all clot and debris.  The peritoneum was closed with 2-0 vicryl in a running fashion.  The fascia and rectus muscles were inspected and were hemostatic. The fascia was closed with 0 Vicryl in a running fashion. The subcutaneous layer was irrigated and all bleeders cauterized. The subcutaneous layer was closed with interrupted plain gut. The skin was closed with 3-0 monocryl in a subcuticular fashion. The incision was dressed with benzoine, steri strips and honeycomb dressing. All sponge lap and needle counts were correct x3. Patient tolerated the procedure well and recovered in stable condition following the procedure.

## 2017-11-23 NOTE — MAU Note (Signed)
Decision for C/S, process for OR prep explained, pt wants some time to make some phone calls, "I have to get some things settled", is tearful.

## 2017-11-24 ENCOUNTER — Encounter (HOSPITAL_COMMUNITY): Payer: Self-pay | Admitting: Obstetrics

## 2017-11-24 LAB — CBC
HCT: 28 % — ABNORMAL LOW (ref 36.0–46.0)
Hemoglobin: 9.5 g/dL — ABNORMAL LOW (ref 12.0–15.0)
MCH: 29.2 pg (ref 26.0–34.0)
MCHC: 33.9 g/dL (ref 30.0–36.0)
MCV: 86.2 fL (ref 80.0–100.0)
NRBC: 0 % (ref 0.0–0.2)
Platelets: 208 10*3/uL (ref 150–400)
RBC: 3.25 MIL/uL — AB (ref 3.87–5.11)
RDW: 14.8 % (ref 11.5–15.5)
WBC: 8.3 10*3/uL (ref 4.0–10.5)

## 2017-11-24 LAB — RPR: RPR: NONREACTIVE

## 2017-11-24 MED ORDER — FERROUS SULFATE 325 (65 FE) MG PO TABS
325.0000 mg | ORAL_TABLET | Freq: Two times a day (BID) | ORAL | Status: DC
  Administered 2017-11-24 – 2017-11-26 (×5): 325 mg via ORAL
  Filled 2017-11-24 (×4): qty 1

## 2017-11-24 NOTE — Progress Notes (Signed)
  Patient is eating, ambulating, voiding.  Pain control is good.  Vitals:   11/24/17 0211 11/24/17 0346 11/24/17 0419 11/24/17 0500  BP:    119/72  Pulse:    67  Resp:    16  Temp:    98 F (36.7 C)  TempSrc:    Oral  SpO2: 100% 99% 99% 99%  Weight:        lungs:   clear to auscultation cor:    RRR Abdomen:  soft, appropriate tenderness, incisions intact and without erythema or exudate ex:    no cords   Lab Results  Component Value Date   WBC 8.3 11/24/2017   HGB 9.5 (L) 11/24/2017   HCT 28.0 (L) 11/24/2017   MCV 86.2 11/24/2017   PLT 208 11/24/2017    --/--/O POS (10/28 1315)/RI  A/P    Post operative day 1.  Routine post op and postpartum care.  Expect d/c tomorrow.  Percocet for pain control. Iron for anemia.

## 2017-11-24 NOTE — Anesthesia Postprocedure Evaluation (Signed)
Anesthesia Post Note  Patient: Meagan Ross  Procedure(s) Performed: REPEAT CESAREAN SECTION (N/A Abdomen)     Patient location during evaluation: Mother Baby Anesthesia Type: Spinal Level of consciousness: awake and alert Pain management: pain level controlled Vital Signs Assessment: post-procedure vital signs reviewed and stable Respiratory status: spontaneous breathing, nonlabored ventilation and respiratory function stable Cardiovascular status: stable Postop Assessment: no headache, no backache, spinal receding, able to ambulate, adequate PO intake, no apparent nausea or vomiting and patient able to bend at knees Anesthetic complications: no    Last Vitals:  Vitals:   11/24/17 0419 11/24/17 0500  BP:  119/72  Pulse:  67  Resp:  16  Temp:  36.7 C  SpO2: 99% 99%    Last Pain:  Vitals:   11/24/17 0739  TempSrc:   PainSc: Asleep   Pain Goal:                 Laban Emperor

## 2017-11-24 NOTE — Progress Notes (Signed)
Pt briefly returned to room from NICU to use restroom. At this time, RN entered room to introduce self and round on pt. Pt asked when she could have pain medication again. RN stated she would go get it. Within 10 minutes, pt was back to NICU and did not receive pain medication before she left. Will attempt to give medication when pt returns.

## 2017-11-24 NOTE — Lactation Note (Signed)
This note was copied from a baby's chart. Lactation Consultation Note  Patient Name: Meagan Ross Date: 11/24/2017 Reason for consult: Initial assessment;Infant weight loss;Term;NICU baby  63 hours old FT NICU female who is being mostly formula fed; mom called for a scheduled feeding at 9 pm. NICU RN phoned LC to be here 10 minutes earlier since baby was awake already. Mom is a P3 and she has BF her other children; but has almost "forgoten" about it, her kids are 5 and 11 y.o. She has been set up with a DEBP and has pumped twice already. Explained to mom the importance of consistent pumping.  When [facility] assisted mom with hand expression, no colostrum was noted, mom had some breast sensitivity, she voiced it when [facility] was doing compressions. LC took baby to mom's right breast, after a few attempts she was able to latch in cross cradle position but mom was uncomfortable, LC recommended trying a different position, like football the next time. Baby was on and off the breast, she needed constant repositioning, no audible swallows were heard; but noticed some extension of the jaw.  Feeding plan:  1. Encouraged mom to put baby STS to the breast and try to feed her when she has her scheduled formula feedings in the NICU. 2. Mom will pump every 3 hours and at least once at night; a total of 8 pumping sessions/24 hours. She understands that pumping at this stage is mainly for breast stimulation and not to get volume 3. Hand expression was also encouraged prior latching baby to the breast  LC left BF brochure, BF resources and feeding diary on mom's room (125) after she came back from NICU, since this is the first time mom is seen by lactation. Mom and dad aware of LC services and will call PRN.  Maternal Data Formula Feeding for Exclusion: No Has patient been taught Hand Expression?: Yes Does the patient have breastfeeding experience prior to this delivery?: Yes  Feeding Feeding Type:  Breast Fed Nipple Type: Slow - flow  LATCH Score Latch: Repeated attempts needed to sustain latch, nipple held in mouth throughout feeding, stimulation needed to elicit sucking reflex.  Audible Swallowing: None  Type of Nipple: Everted at rest and after stimulation  Comfort (Breast/Nipple): Soft / non-tender  Hold (Positioning): Assistance needed to correctly position infant at breast and maintain latch.  LATCH Score: 6  Interventions Interventions: Breast feeding basics reviewed;Assisted with latch;Breast massage;Hand express;Breast compression;Adjust position;DEBP;Support pillows  Lactation Tools Discussed/Used Tools: Pump Breast pump type: Double-Electric Breast Pump Pump Review: Setup, frequency, and cleaning Initiated by:: RN Date initiated:: 11/23/17   Consult Status Consult Status: Follow-up Date: 11/25/17 Follow-up type: In-patient    Corday Wyka Venetia Constable 11/24/2017, 9:31 PM

## 2017-11-24 NOTE — Lactation Note (Signed)
This note was copied from a baby's chart. Lactation Consultation Note Late entry for 11/14/2017   0200 Baby in NICU. Discussed mom's plans for feeding when she goes home w/the baby. Mom "hopes" to BF. Mom BF one of her other children for 3 weeks but didn't BF the last one.  Mom doesn't have WIC but has [employer]. Mom doesn't have a DEBP at home. Mom asked questions about renting or purchasing a DEBP.  Mom has "V" shaped breast w/everted nipples. Hand expression taught w/no colostrum noted. Breast tender. Supply and demand, STS, milk storage, and newborn behavior discussed. Gave colostrum vials w/stickers w/instructions. Mom shown how to use DEBP & how to disassemble, clean, & reassemble parts. Mom knows to pump q3h for 15-20 min. Breast massage encouraged. Answered questions mom had. Encouraged to call for further questions. WH/LC brochure given w/resources, support groups and LC services.  Patient Name: Meagan Ross ZOXWR'U Date: 11/24/2017 Reason for consult: Initial assessment;NICU baby   Maternal Data Formula Feeding for Exclusion: No Has patient been taught Hand Expression?: Yes Does the patient have breastfeeding experience prior to this delivery?: Yes  Feeding Feeding Type: Formula Nipple Type: Slow - flow  LATCH Score Latch: Repeated attempts needed to sustain latch, nipple held in mouth throughout feeding, stimulation needed to elicit sucking reflex.  Audible Swallowing: None  Type of Nipple: Everted at rest and after stimulation  Comfort (Breast/Nipple): Soft / non-tender  Hold (Positioning): Assistance needed to correctly position infant at breast and maintain latch.  LATCH Score: 6  Interventions Interventions: Breast feeding basics reviewed;Assisted with latch;Breast massage;Hand express;Breast compression;Adjust position;DEBP;Support pillows  Lactation Tools Discussed/Used Tools: Pump Breast pump type: Double-Electric Breast Pump Pump Review:  Setup, frequency, and cleaning Initiated by:: RN Date initiated:: 11/23/17   Consult Status Consult Status: Follow-up Date: 11/25/17 Follow-up type: In-patient    Charyl Dancer 11/24/2017, 11:05 PM

## 2017-11-25 ENCOUNTER — Encounter (HOSPITAL_COMMUNITY)
Admission: RE | Admit: 2017-11-25 | Discharge: 2017-11-25 | Disposition: A | Discharge status: Home / Self Care | Payer: Medicaid Other | Source: Ambulatory Visit

## 2017-11-25 NOTE — Progress Notes (Signed)
Patient is eating, ambulating, voiding.  Pain control is good.  Appropriate lochia, no complaints.  Vitals:   11/24/17 1328 11/24/17 1626 11/24/17 2226 11/25/17 0600  BP: (!) 117/59 133/62 121/76 122/72  Pulse: 61 63 65 70  Resp: 18 18 20 18   Temp: 98 F (36.7 C) 97.7 F (36.5 C) 98.3 F (36.8 C) 98 F (36.7 C)  TempSrc: Oral Oral Oral Oral  SpO2: 98% 100%    Weight:        Fundus firm Inc: c/d/i Ext: no calf tenderness  Lab Results  Component Value Date   WBC 8.3 11/24/2017   HGB 9.5 (L) 11/24/2017   HCT 28.0 (L) 11/24/2017   MCV 86.2 11/24/2017   PLT 208 11/24/2017    --/--/O POS (10/28 1315)  A/P Post op day #2 s/p repeat c/s Doing well BPs normal range, plt yesterday normal Baby in NICU improving  Routine care.    Philip Aspen

## 2017-11-25 NOTE — Lactation Note (Signed)
This note was copied from a baby's chart. Lactation Consultation Note  Patient Name: Meagan Ross ZOXWR'U Date: 11/25/2017 Reason for consult: Follow-up assessment;1st time breastfeeding;NICU baby;Term  Visited with P3 Mom of baby in the NICU.  Mom has been latching baby on and off in the NICU, and double pumping using Symphony.  Reviewed importance of regular double pumping and breast massage and hand expression to stimulate her milk supply.   Reviewed importance of taking pump parts apart and washing after each use.   Coconut Oil given with instructions on use for lubrication while pumping.   Mom asking questions about purchasing a DEBP.  Mom aware of rental pump program out of gift shop in hospital.  Recommended Medela or Spectra. Second basin given with cloth diaper for drying of pump parts.  Curved tip syringe given to help collect colostrum into containers.   Interventions Interventions: Breast feeding basics reviewed;Skin to skin;Breast massage;Hand express;DEBP;Coconut oil  Lactation Tools Discussed/Used Tools: Pump;Coconut oil Breast pump type: Double-Electric Breast Pump   Consult Status Consult Status: Follow-up Date: 11/26/17 Follow-up type: In-patient    Meagan Ross 11/25/2017, 11:41 AM

## 2017-11-25 NOTE — Lactation Note (Signed)
This note was copied from a baby's chart. Lactation Consultation Note  Patient Name: Meagan Ross ZOXWR'U Date: 11/25/2017 Reason for consult: Follow-up assessment  Hand expression was taught to Mom, but no colostrum was noted. Mom was shown how to wash pump parts & Dr. Theora Gianotti Bottle (Preemie).  Dr. Theora Gianotti bottle is due to be sanitized/sterilized at 0600.  Lactation # written on the board for Mom to call for assist with next feeding.   Lurline Hare [facility] 11/25/2017, 3:07 PM

## 2017-11-26 MED ORDER — IBUPROFEN 600 MG PO TABS: 600.0000 mg | ORAL_TABLET | Freq: Four times a day (QID) | ORAL | 0 refills | Status: DC | PRN

## 2017-11-26 MED ORDER — DOCUSATE SODIUM 100 MG PO CAPS: 100.0000 mg | ORAL_CAPSULE | Freq: Two times a day (BID) | ORAL | 0 refills | Status: DC

## 2017-11-26 MED ORDER — OXYCODONE-ACETAMINOPHEN 5-325 MG PO TABS: 1.0000 | ORAL_TABLET | Freq: Four times a day (QID) | ORAL | 0 refills | Status: DC | PRN

## 2017-11-26 NOTE — Lactation Note (Signed)
This note was copied from a baby's chart. Lactation Consultation Note  Patient Name: [name] RUEAV'W Date: 11/26/2017 Reason for consult: Follow-up assessment Mom states she hasn't been pumping or putting baby to breast because she is not feeling well and in pain.  Baby recently had formula feeding.  Instructed to call for assist with next feeding.  Instructed to pump every 3 hours to establish milk supply.  Mom agreeable.  Maternal Data    Feeding Feeding Type: Breast Fed Nipple Type: Dr. Lorne Skeens  LATCH Score Latch: Grasps breast easily, tongue down, lips flanged, rhythmical sucking.  Audible Swallowing: A few with stimulation  Type of Nipple: Everted at rest and after stimulation  Comfort (Breast/Nipple): Filling, red/small blisters or bruises, mild/mod discomfort  Hold (Positioning): No assistance needed to correctly position infant at breast.  LATCH Score: 8  Interventions Interventions: DEBP;Coconut oil  Lactation Tools Discussed/Used     Consult Status Consult Status: Follow-up Date: 11/27/17 Follow-up type: In-patient    Huston Foley 11/26/2017, 9:31 AM

## 2017-11-30 ENCOUNTER — Inpatient Hospital Stay (HOSPITAL_COMMUNITY): Payer: Medicaid Other

## 2017-11-30 ENCOUNTER — Encounter (HOSPITAL_COMMUNITY): Payer: Self-pay | Admitting: *Deleted

## 2017-11-30 ENCOUNTER — Other Ambulatory Visit: Payer: Self-pay

## 2017-11-30 ENCOUNTER — Inpatient Hospital Stay (HOSPITAL_COMMUNITY)
Admission: AD | Admit: 2017-11-30 | Discharge: 2017-12-02 | DRG: 776 | Disposition: A | Discharge status: Home / Self Care | Payer: Medicaid Other | Attending: Obstetrics and Gynecology | Admitting: Obstetrics and Gynecology

## 2017-11-30 DIAGNOSIS — R0602 Shortness of breath: Secondary | ICD-10-CM

## 2017-11-30 DIAGNOSIS — Z87891 Personal history of nicotine dependence: Secondary | ICD-10-CM | POA: Diagnosis not present

## 2017-11-30 DIAGNOSIS — O1495 Unspecified pre-eclampsia, complicating the puerperium: Secondary | ICD-10-CM

## 2017-11-30 DIAGNOSIS — R51 Headache: Secondary | ICD-10-CM | POA: Diagnosis present

## 2017-11-30 LAB — URINALYSIS, ROUTINE W REFLEX MICROSCOPIC
Bilirubin Urine: NEGATIVE
Glucose, UA: NEGATIVE mg/dL
KETONES UR: NEGATIVE mg/dL
LEUKOCYTES UA: NEGATIVE
Nitrite: NEGATIVE
PH: 8 (ref 5.0–8.0)
Protein, ur: NEGATIVE mg/dL
SPECIFIC GRAVITY, URINE: 1.002 — AB (ref 1.005–1.030)

## 2017-11-30 LAB — PROTEIN / CREATININE RATIO, URINE
Creatinine, Urine: 17 mg/dL
Total Protein, Urine: <6 mg/dL

## 2017-11-30 LAB — CBC
HEMATOCRIT: 34.2 % — AB (ref 36.0–46.0)
HEMOGLOBIN: 10.9 g/dL — AB (ref 12.0–15.0)
MCH: 28 pg (ref 26.0–34.0)
MCHC: 31.9 g/dL (ref 30.0–36.0)
MCV: 87.9 fL (ref 80.0–100.0)
NRBC: 0 % (ref 0.0–0.2)
Platelets: 233 10*3/uL (ref 150–400)
RBC: 3.89 MIL/uL (ref 3.87–5.11)
RDW: 14.9 % (ref 11.5–15.5)
WBC: 5 10*3/uL (ref 4.0–10.5)

## 2017-11-30 LAB — COMPREHENSIVE METABOLIC PANEL
ALBUMIN: 3.3 g/dL — AB (ref 3.5–5.0)
ALK PHOS: 90 U/L (ref 38–126)
ALT: 35 U/L (ref 0–44)
ANION GAP: 10 (ref 5–15)
AST: 26 U/L (ref 15–41)
BILIRUBIN TOTAL: 0.6 mg/dL (ref 0.3–1.2)
BUN: 9 mg/dL (ref 6–20)
CALCIUM: 8.7 mg/dL — AB (ref 8.9–10.3)
CO2: 25 mmol/L (ref 22–32)
CREATININE: 1.09 mg/dL — AB (ref 0.44–1.00)
Chloride: 107 mmol/L (ref 98–111)
GFR calc Af Amer: >60 mL/min (ref >60)
GFR calc non Af Amer: >60 mL/min (ref >60)
GLUCOSE: 87 mg/dL (ref 70–99)
Potassium: 3.1 mmol/L — ABNORMAL LOW (ref 3.5–5.1)
SODIUM: 142 mmol/L (ref 135–145)
TOTAL PROTEIN: 6.8 g/dL (ref 6.5–8.1)

## 2017-11-30 MED ORDER — ENOXAPARIN SODIUM 40 MG/0.4ML ~~LOC~~ SOLN
40.0000 mg | SUBCUTANEOUS | Status: DC
  Administered 2017-11-30 – 2017-12-01 (×2): 40 mg via SUBCUTANEOUS
  Filled 2017-11-30 (×2): qty 0.4

## 2017-11-30 MED ORDER — HYDRALAZINE HCL 20 MG/ML IJ SOLN: 10.0000 mg | INTRAMUSCULAR | Status: DC | PRN

## 2017-11-30 MED ORDER — PRENATAL MULTIVITAMIN CH
1.0000 | ORAL_TABLET | Freq: Every day | ORAL | Status: DC
  Administered 2017-11-30 – 2017-12-01 (×2): 1 via ORAL
  Filled 2017-11-30 (×2): qty 1

## 2017-11-30 MED ORDER — LACTATED RINGERS IV SOLN
INTRAVENOUS | Status: DC
  Administered 2017-11-30: 12:00:00 via INTRAVENOUS

## 2017-11-30 MED ORDER — LACTATED RINGERS IV SOLN
INTRAVENOUS | Status: DC
  Administered 2017-11-30 – 2017-12-01 (×2): via INTRAVENOUS

## 2017-11-30 MED ORDER — MAGNESIUM SULFATE BOLUS VIA INFUSION
4.0000 g | Freq: Once | INTRAVENOUS | Status: AC
  Administered 2017-11-30: 4 g via INTRAVENOUS
  Filled 2017-11-30: qty 500

## 2017-11-30 MED ORDER — ACETAMINOPHEN 500 MG PO TABS
1000.0000 mg | ORAL_TABLET | Freq: Once | ORAL | Status: AC
  Administered 2017-11-30: 1000 mg via ORAL
  Filled 2017-11-30: qty 2

## 2017-11-30 MED ORDER — ACETAMINOPHEN 325 MG PO TABS
650.0000 mg | ORAL_TABLET | ORAL | Status: DC | PRN
  Administered 2017-11-30: 650 mg via ORAL
  Filled 2017-11-30: qty 2

## 2017-11-30 MED ORDER — MAGNESIUM SULFATE 40 G IN LACTATED RINGERS - SIMPLE
2.0000 g/h | INTRAVENOUS | Status: DC
  Administered 2017-11-30: 2 g/h via INTRAVENOUS
  Filled 2017-11-30: qty 500

## 2017-11-30 MED ORDER — LABETALOL HCL 5 MG/ML IV SOLN
20.0000 mg | INTRAVENOUS | Status: DC | PRN
  Administered 2017-11-30: 20 mg via INTRAVENOUS
  Filled 2017-11-30: qty 4

## 2017-11-30 MED ORDER — LABETALOL HCL 5 MG/ML IV SOLN: 40.0000 mg | INTRAVENOUS | Status: DC | PRN

## 2017-11-30 MED ORDER — LABETALOL HCL 5 MG/ML IV SOLN: 80.0000 mg | INTRAVENOUS | Status: DC | PRN

## 2017-11-30 NOTE — MAU Note (Signed)
C/s on 10/28- was sent in because of BP. Came in today  because of HA, causing some blurring.  Denies epigastric pain or increase in swelling.

## 2017-11-30 NOTE — Lactation Note (Addendum)
Lactation Consultation Note  Patient Name: Meagan Ross ZOXWR'U Date: 11/30/2017  Referral from RN.  Mom post partum readmit with high blood pressure. Inittiated pumping with mom.  Mom reports her breasts have been so engorged the past two days that baby could not latch.  Mom reporots she has been trying to remove milk with a manual breast pump but has not been getting much and her breasts are still engorged. Mom has been googling things to try and make them feel better but nothing has worked she reports. Showed mom how to ,massagge and compress the breasts to soften them.  Mom reports she feels much better past pumping.  Showed mom how to massage and compress to completely soften breass.  Urged her to try and pump every 2-3 hours.  Mom reports she has ordered a DEBP for home use.  Urged mom to call as needed.  Will follow up as needed   Maternal Data    Feeding    LATCH Score                   Interventions    Lactation Tools Discussed/Used     Consult Status      Liat Mayol Michaelle Copas 11/30/2017, 4:06 PM

## 2017-11-30 NOTE — MAU Note (Signed)
Pt doesn't want an IV if at all possible, requesting to speak with provider.  Blanche East NP informed, in to see pt.

## 2017-11-30 NOTE — MAU Note (Signed)
Pt back from XR, states she agrees to stay, very tearful.

## 2017-11-30 NOTE — MAU Provider Note (Signed)
History     CSN: [id number]  Arrival date and time: 11/30/17 1010   First Provider Initiated Contact with Patient 11/30/17 1044      No chief complaint on file.  HPI   Meagan Ross is a 27 y.o. female [id number] postpartum, status post repeat cesarean section on 11/23/17 due to gestational HTN. She did not developed severe preeclampsia and she did not receive magnesium. She presents with HA that started Sunday, she rates her pain 7/10 at this time. HA pain causing blurred vision at times. + SOB which is not new. Says it worsens with walking.   OB History    Gravida  5   Para  3   Term  3   Preterm      AB  2   Living  3     SAB  1   TAB  1   Ectopic      Multiple  0   Live Births  3           Past Medical History:  Diagnosis Date  . Asthma   . Bronchitis   . Headache     Past Surgical History:  Procedure Laterality Date  . CESAREAN SECTION     breech and 2nd for RC/S  . CESAREAN SECTION N/A 11/23/2017   Procedure: REPEAT CESAREAN SECTION;  Surgeon: Jerelyn Charles, MD;  Location: [place];  Service: Obstetrics;  Laterality: N/A;  . NOSE SURGERY      Family History  Problem Relation Age of Onset  . Hypertension Maternal Grandmother     Social History   Tobacco Use  . Smoking status: Former Smoker    Packs/day: 0.50    Years: 5.00    Pack years: 2.50    Types: Cigarettes    Last attempt to quit: 03/14/2017    Years since quitting: 0.7  . Smokeless tobacco: Never Used  . Tobacco comment: states "need to"  Substance Use Topics  . Alcohol use: No    Frequency: Never  . Drug use: No    Allergies:  Allergies  Allergen Reactions  . Penicillins Rash and Other (See Comments)    Has patient had a PCN reaction causing immediate rash, facial/tongue/throat swelling, SOB or lightheadedness with hypotension: Yes Has patient had a PCN reaction causing severe rash involving mucus membranes or skin necrosis: No Has patient had a PCN  reaction that required hospitalization: No Has patient had a PCN reaction occurring within the last 10 years: Yes If all of the above answers are "NO", then may proceed with Cephalosporin use.     Medications Prior to Admission  Medication Sig Dispense Refill Last Dose  . docusate sodium (COLACE) 100 MG capsule Take 1 capsule (100 mg total) by mouth 2 (two) times daily. 60 capsule 0   . ibuprofen (ADVIL,MOTRIN) 600 MG tablet Take 1 tablet (600 mg total) by mouth every 6 (six) hours as needed. 90 tablet 0   . oxyCODONE-acetaminophen (PERCOCET/ROXICET) 5-325 MG tablet Take 1-2 tablets by mouth every 6 (six) hours as needed for severe pain. 30 tablet 0    Results for orders placed or performed during the hospital encounter of 11/30/17 (from the past 48 hour(s))  CBC     Status: Abnormal   Collection Time: 11/30/17 10:58 AM  Result Value Ref Range   WBC 5.0 4.0 - 10.5 K/uL   RBC 3.89 3.87 - 5.11 MIL/uL   Hemoglobin 10.9 (L) 12.0 - 15.0 g/dL   HCT  34.2 (L) 36.0 - 46.0 %   MCV 87.9 80.0 - 100.0 fL   MCH 28.0 26.0 - 34.0 pg   MCHC 31.9 30.0 - 36.0 g/dL   RDW 14.9 11.5 - 15.5 %   Platelets 233 150 - 400 K/uL   nRBC 0.0 0.0 - 0.2 %    Comment: Performed at [facility], [address], [place], [place] [address]  Comprehensive metabolic panel     Status: Abnormal   Collection Time: 11/30/17 10:58 AM  Result Value Ref Range   Sodium 142 135 - 145 mmol/L   Potassium 3.1 (L) 3.5 - 5.1 mmol/L   Chloride 107 98 - 111 mmol/L   CO2 25 22 - 32 mmol/L   Glucose, Bld 87 70 - 99 mg/dL   BUN 9 6 - 20 mg/dL   Creatinine, Ser 1.09 (H) 0.44 - 1.00 mg/dL   Calcium 8.7 (L) 8.9 - 10.3 mg/dL   Total Protein 6.8 6.5 - 8.1 g/dL   Albumin 3.3 (L) 3.5 - 5.0 g/dL   AST 26 15 - 41 U/L   ALT 35 0 - 44 U/L   Alkaline Phosphatase 90 38 - 126 U/L   Total Bilirubin 0.6 0.3 - 1.2 mg/dL   GFR calc non Af Amer >60 >60 mL/min   GFR calc Af Amer >60 >60 mL/min    Comment: (NOTE) The eGFR has been calculated  using the CKD EPI equation. This calculation has not been validated in all clinical situations. eGFR's persistently <60 mL/min signify possible Chronic Kidney Disease.    Anion gap 10 5 - 15    Comment: Performed at [facility], [address], [place], [place] [address]  Protein / creatinine ratio, urine     Status: None   Collection Time: 11/30/17 11:00 AM  Result Value Ref Range   Creatinine, Urine 17.00 mg/dL   Total Protein, Urine <6 mg/dL   Protein Creatinine Ratio        0.00 - 0.15 mg/mg[Cre]    Comment: RESULT BELOW REPORTABLE RANGE, UNABLE TO CALCULATE. Performed at [facility], [address], [place], [place] [address]   Urinalysis, Routine w reflex microscopic     Status: Abnormal   Collection Time: 11/30/17 11:05 AM  Result Value Ref Range   Color, Urine STRAW (A) YELLOW   APPearance CLEAR CLEAR   Specific Gravity, Urine 1.002 (L) 1.005 - 1.030   pH 8.0 5.0 - 8.0   Glucose, UA NEGATIVE NEGATIVE mg/dL   Hgb urine dipstick LARGE (A) NEGATIVE   Bilirubin Urine NEGATIVE NEGATIVE   Ketones, ur NEGATIVE NEGATIVE mg/dL   Protein, ur NEGATIVE NEGATIVE mg/dL   Nitrite NEGATIVE NEGATIVE   Leukocytes, UA NEGATIVE NEGATIVE   RBC / HPF 0-5 0 - 5 RBC/hpf   WBC, UA 0-5 0 - 5 WBC/hpf   Bacteria, UA RARE (A) NONE SEEN   Squamous Epithelial / LPF 0-5 0 - 5    Comment: Performed at [facility], [address], [place], [place] [address]   Dg Chest 2 View  Result Date: 11/30/2017 CLINICAL DATA:  Dyspnea. Postpartum with recent cesarean delivery. Preeclampsia. EXAM: CHEST - 2 VIEW COMPARISON:  01/21/2012 chest radiograph. FINDINGS: Stable cardiomediastinal silhouette with normal heart size. No pneumothorax. Trace right pleural effusion. No left pleural effusion. No pulmonary edema. No acute consolidative airspace disease. IMPRESSION: Trace right pleural effusion. Otherwise no active disease in the chest. Electronically Signed   By: Ilona Sorrel M.D.   On: 11/30/2017  11:37  Review of Systems  Eyes: Negative for visual disturbance.  Respiratory: Positive for shortness of breath.   Gastrointestinal: Negative for abdominal pain.  Neurological: Positive for headaches.   Physical Exam   Blood pressure (!) 176/90, pulse (!) 58, temperature 97.9 F (36.6 C), temperature source Oral, resp. rate 18, height '5\' 3"'  (1.6 m), weight 93.8 kg, SpO2 98 %, currently breastfeeding.  Patient Vitals for the past 24 hrs:  BP Temp Temp src Pulse Resp SpO2 Height Weight  11/30/17 1407 (!) 147/88 - - 66 16 99 % - -  11/30/17 1254 (!) 150/81 - - 66 16 98 % - -  11/30/17 1248 (!) 151/78 - - 66 16 98 % - -  11/30/17 1243 (!) 155/82 - - 61 16 97 % - -  11/30/17 1238 (!) 168/83 - - 62 17 97 % - -  11/30/17 1224 (!) 176/93 98.8 F (37.1 C) Oral (!) 59 17 100 % - -  11/30/17 1201 (!) 156/87 - - (!) 0 - - - -  11/30/17 1147 (!) 180/98 - - 63 - - - -  11/30/17 1116 (!) 159/84 - - 78 - - - -  11/30/17 1115 - - - - - 98 % - -  11/30/17 1101 (!) 155/82 - - 60 - - - -  11/30/17 1056 (!) 151/87 - - 62 - - - -  11/30/17 1046 (!) 148/85 - - 68 - - - -  11/30/17 1024 (!) 176/90 97.9 F (36.6 C) Oral (!) 58 18 98 % '5\' 3"'  (1.6 m) 93.8 kg   Physical Exam  Constitutional: She is oriented to person, place, and time. She appears well-developed and well-nourished. No distress.  HENT:  Head: Normocephalic.  Cardiovascular: Normal rate.  Respiratory: Effort normal.  GI: Soft. She exhibits no distension. There is no tenderness. There is no rebound.  Musculoskeletal: Normal range of motion.  Neurological: She is alert and oriented to person, place, and time.  Skin: Skin is warm. She is not diaphoretic.  Psychiatric: Her behavior is normal.   MAU Course  Procedures  None  MDM  PIH labs ordered Cxray ordered Discussed admission with Dr. Harrington Challenger.   Assessment and Plan   A:  1. Shortness of breath   2. Preeclampsia in postpartum period     P:  Admit to 3rd floor  Magnesium  for seizure prophylaxis Patient made aware of the plan.  Noni Saupe I, NP 11/30/2017 2:26 PM

## 2017-11-30 NOTE — MAU Note (Signed)
Pt making phone calls, to decide if she agrees with plan for admission.

## 2017-11-30 NOTE — H&P (Signed)
Meagan Ross is a 27 y.o. female presenting for headache and blurred vision OB History    Gravida  5   Para  3   Term  3   Preterm      AB  2   Living  3     SAB  1   TAB  1   Ectopic      Multiple  0   Live Births  3          Past Medical History:  Diagnosis Date  . Asthma   . Bronchitis   . Headache    Past Surgical History:  Procedure Laterality Date  . CESAREAN SECTION     breech and 2nd for RC/S  . CESAREAN SECTION N/A 11/23/2017   Procedure: REPEAT CESAREAN SECTION;  Surgeon: Marlow Baars, MD;  Location: [facility];  Service: Obstetrics;  Laterality: N/A;  . NOSE SURGERY     Family History: family history includes Hypertension in her maternal grandmother. Social History:  reports that she quit smoking about 8 months ago. Her smoking use included cigarettes. She has a 2.50 pack-year smoking history. She has never used smokeless tobacco. She reports that she does not drink alcohol or use drugs.       ROS History   Blood pressure (!) 148/85, pulse 68, temperature 97.9 F (36.6 C), temperature source Oral, resp. rate 18, height 5\' 3"  (1.6 m), weight 93.8 kg, SpO2 98 %, currently breastfeeding. Exam Physical Exam  Prenatal labs: ABO, Rh: --/--/O POS (10/28 1315) Antibody: NEG (10/28 1315) Rubella: Immune (05/13 0000) RPR: Non Reactive (10/28 1315)  HBsAg: Negative (05/13 0000)  HIV: Non-reactive (05/13 0000)  GBS: Negative (10/09 0000)   Assessment/Plan: 1) [place] for PP Pre-eclampsia 2) Start Magnesium sulfate for seizure prophylaxis. Given c/o SOB check CXR 3) PIH labs pending 4) Start labetalol protocol   Waynard Reeds 11/30/2017, 10:57 AM

## 2017-11-30 NOTE — MAU Note (Signed)
HA since 11/29/17 took meds but only helped a little. Pain "7", SOB since Sunday, O2 today is 98%. Started having SOB after taking her daughter to dr. Alfonzo Beers.. Pt delivered Monday, Oct. 28 by C/S.

## 2017-12-01 MED ORDER — LABETALOL HCL 200 MG PO TABS
200.0000 mg | ORAL_TABLET | Freq: Once | ORAL | Status: AC
  Administered 2017-12-01: 200 mg via ORAL

## 2017-12-01 MED ORDER — LABETALOL HCL 200 MG PO TABS
200.0000 mg | ORAL_TABLET | Freq: Once | ORAL | Status: AC
  Administered 2017-12-01: 200 mg via ORAL
  Filled 2017-12-01: qty 1

## 2017-12-01 MED ORDER — NIFEDIPINE ER OSMOTIC RELEASE 30 MG PO TB24
30.0000 mg | ORAL_TABLET | Freq: Every day | ORAL | Status: DC
  Administered 2017-12-01 – 2017-12-02 (×2): 30 mg via ORAL
  Filled 2017-12-01 (×2): qty 1

## 2017-12-01 MED ORDER — LABETALOL HCL 200 MG PO TABS
200.0000 mg | ORAL_TABLET | Freq: Three times a day (TID) | ORAL | Status: DC
  Administered 2017-12-01 – 2017-12-02 (×2): 200 mg via ORAL
  Filled 2017-12-01 (×3): qty 1

## 2017-12-01 NOTE — Progress Notes (Signed)
Pt has no complaints today. Her PIH labs were all wnl. She has had elevated B/Ps and was started on procardia. She has completed 24 hours of MgSO4. She desires to go home. Her B/Ps are not controlled at this time. Will give 200mg  of labetalol now

## 2017-12-02 MED ORDER — NIFEDIPINE ER 30 MG PO TB24: 30.0000 mg | ORAL_TABLET | Freq: Every day | ORAL | 4 refills | Status: DC

## 2017-12-02 MED ORDER — LABETALOL HCL 200 MG PO TABS: 200.0000 mg | ORAL_TABLET | Freq: Three times a day (TID) | ORAL | 6 refills | Status: DC

## 2017-12-02 NOTE — Discharge Summary (Signed)
Physician Discharge Summary  Patient ID: Meagan Ross MRN: [id number] DOB/AGE: 1990-03-06 27 y.o.  Admit date: 11/30/2017 Discharge date: 12/02/2017  Admission Diagnoses:post partum preeclampsia and htn  Discharge Diagnoses: same Active Problems:   * No active hospital problems. *   Discharged Condition: good  Hospital Course: Pt admitted for 24 hours of magnesium sulfate and blood pressure control.  No signs of HELLP.  BP controlled finally with labetalol and procardia.  Consults: None  Significant Diagnostic Studies: labs:  Results for orders placed or performed during the hospital encounter of 11/30/17 (from the past 72 hour(s))  CBC     Status: Abnormal   Collection Time: 11/30/17 10:58 AM  Result Value Ref Range   WBC 5.0 4.0 - 10.5 K/uL   RBC 3.89 3.87 - 5.11 MIL/uL   Hemoglobin 10.9 (L) 12.0 - 15.0 g/dL   HCT 34.2 (L) 36.0 - 46.0 %   MCV 87.9 80.0 - 100.0 fL   MCH 28.0 26.0 - 34.0 pg   MCHC 31.9 30.0 - 36.0 g/dL   RDW 14.9 11.5 - 15.5 %   Platelets 233 150 - 400 K/uL   nRBC 0.0 0.0 - 0.2 %    Comment: Performed at [facility], [address], [place], [place] [address]  Comprehensive metabolic panel     Status: Abnormal   Collection Time: 11/30/17 10:58 AM  Result Value Ref Range   Sodium 142 135 - 145 mmol/L   Potassium 3.1 (L) 3.5 - 5.1 mmol/L   Chloride 107 98 - 111 mmol/L   CO2 25 22 - 32 mmol/L   Glucose, Bld 87 70 - 99 mg/dL   BUN 9 6 - 20 mg/dL   Creatinine, Ser 1.09 (H) 0.44 - 1.00 mg/dL   Calcium 8.7 (L) 8.9 - 10.3 mg/dL   Total Protein 6.8 6.5 - 8.1 g/dL   Albumin 3.3 (L) 3.5 - 5.0 g/dL   AST 26 15 - 41 U/L   ALT 35 0 - 44 U/L   Alkaline Phosphatase 90 38 - 126 U/L   Total Bilirubin 0.6 0.3 - 1.2 mg/dL   GFR calc non Af Amer >60 >60 mL/min   GFR calc Af Amer >60 >60 mL/min    Comment: (NOTE) The eGFR has been calculated using the CKD EPI equation. This calculation has not been validated in all clinical situations. eGFR's persistently  <60 mL/min signify possible Chronic Kidney Disease.    Anion gap 10 5 - 15    Comment: Performed at [facility], [address], [place], [place] [address]  Protein / creatinine ratio, urine     Status: None   Collection Time: 11/30/17 11:00 AM  Result Value Ref Range   Creatinine, Urine 17.00 mg/dL   Total Protein, Urine <6 mg/dL   Protein Creatinine Ratio        0.00 - 0.15 mg/mg[Cre]    Comment: RESULT BELOW REPORTABLE RANGE, UNABLE TO CALCULATE. Performed at [facility], [address], [place], [place] [address]   Urinalysis, Routine w reflex microscopic     Status: Abnormal   Collection Time: 11/30/17 11:05 AM  Result Value Ref Range   Color, Urine STRAW (A) YELLOW   APPearance CLEAR CLEAR   Specific Gravity, Urine 1.002 (L) 1.005 - 1.030   pH 8.0 5.0 - 8.0   Glucose, UA NEGATIVE NEGATIVE mg/dL   Hgb urine dipstick LARGE (A) NEGATIVE   Bilirubin Urine NEGATIVE NEGATIVE   Ketones, ur NEGATIVE NEGATIVE mg/dL   Protein, ur  NEGATIVE NEGATIVE mg/dL   Nitrite NEGATIVE NEGATIVE   Leukocytes, UA NEGATIVE NEGATIVE   RBC / HPF 0-5 0 - 5 RBC/hpf   WBC, UA 0-5 0 - 5 WBC/hpf   Bacteria, UA RARE (A) NONE SEEN   Squamous Epithelial / LPF 0-5 0 - 5    Comment: Performed at [facility], [address] [place], [place] [address]    Treatments: magnesium, labetalol and procardia.  Discharge Exam: Blood pressure 140/87, pulse 67, temperature 98.8 F (37.1 C), temperature source Oral, resp. rate 17, height '5\' 3"'  (1.6 m), weight 93.8 kg, SpO2 100 %, currently breastfeeding.   Disposition: Discharge disposition: 01-Home or Self Care       Discharge Instructions    Call MD for:  temperature >100.4   Complete by:  As directed    Diet - low sodium heart healthy   Complete by:  As directed    Discharge instructions   Complete by:  As directed    Routine activity.  Take over the counter ibuprophen up to 800 mg every 8 hours for pain.   Discharge wound care:    Complete by:  As directed    Sitz baths and icepacks to perineum.  If stitches, they will dissolve.   Sexual acrtivity   Complete by:  As directed    Nothing per vagina for 6 weeks.     Allergies as of 12/02/2017      Reactions   Penicillins Rash, Other (See Comments)   Has patient had a PCN reaction causing immediate rash, facial/tongue/throat swelling, SOB or lightheadedness with hypotension: Yes Has patient had a PCN reaction causing severe rash involving mucus membranes or skin necrosis: No Has patient had a PCN reaction that required hospitalization: No Has patient had a PCN reaction occurring within the last 10 years: Yes If all of the above answers are "NO", then may proceed with Cephalosporin use.      Medication List    STOP taking these medications   acetaminophen 500 MG tablet Commonly known as:  TYLENOL   ibuprofen 600 MG tablet Commonly known as:  ADVIL,MOTRIN     TAKE these medications   albuterol 108 (90 Base) MCG/ACT inhaler Commonly known as:  PROVENTIL HFA;VENTOLIN HFA Inhale into the lungs every 6 (six) hours as needed for wheezing or shortness of breath.   docusate sodium 100 MG capsule Commonly known as:  COLACE Take 1 capsule (100 mg total) by mouth 2 (two) times daily.   labetalol 200 MG tablet Commonly known as:  NORMODYNE Take 1 tablet (200 mg total) by mouth every 8 (eight) hours.   NIFEdipine 30 MG 24 hr tablet Commonly known as:  ADALAT CC Take 1 tablet (30 mg total) by mouth daily.   oxyCODONE-acetaminophen 5-325 MG tablet Commonly known as:  PERCOCET/ROXICET Take 1-2 tablets by mouth every 6 (six) hours as needed for severe pain.   prenatal multivitamin Tabs tablet Take 1 tablet by mouth daily at 12 noon.            Discharge Care Instructions  (From admission, onward)         Start     Ordered   12/02/17 0000  Discharge wound care:    Comments:  Sitz baths and icepacks to perineum.  If stitches, they will dissolve.    12/02/17 0816         Follow-up Information    Ob/Gyn, [facility] Follow up.   Why:  Friday for BP check  Contact information: [place] [address] [phone]           Signed: Daria Pastures 12/02/2017, 8:19 AM

## 2017-12-02 NOTE — Discharge Instructions (Signed)
Pt should get BP cuff at [facility] or [facility] that fits her arm and take and record BP twice a day. Call office for BP <100/60 or >160/100. Call or come in for worsening of blurry vision, black wavy line in vision, bad headache that does not go away with medicine or rest, boring pain under ribs on R side or unexplained continued vomiting.

## 2017-12-02 NOTE — Progress Notes (Signed)
  Patient is eating, ambulating, voiding.  Pain control is good.  Vitals:   12/01/17 1936 12/01/17 2240 12/01/17 2341 12/02/17 0451  BP: (!) 146/86 (!) 144/91 125/67 130/82  Pulse: 68 68 67 70  Resp: 16  16 16   Temp: 98.8 F (37.1 C)  98.2 F (36.8 C) 98.8 F (37.1 C)  TempSrc: Oral  Oral Oral  SpO2: 97%  94% 95%  Weight:      Height:        lungs:   clear to auscultation cor:    RRR Abdomen:  soft, appropriate tenderness, incisions intact and without erythema or exudate ex:    no cords   Lab Results  Component Value Date   WBC 5.0 11/30/2017   HGB 10.9 (L) 11/30/2017   HCT 34.2 (L) 11/30/2017   MCV 87.9 11/30/2017   PLT 233 11/30/2017    --/--/O POS (10/28 1315)/RI  A/P    Post operative day 8, HD #2 for pp preeclampsia with normal labs.  Pt s/p magnesium sulfate for 24 hours.  On Procardia 30 mg XL and labetalol 200 mg TID for BP control.  BPs better this am.  Routine post op and postpartum care otherwise.  Expect d/c today.  Percocet for pain control.

## 2017-12-02 NOTE — Progress Notes (Signed)
Discharge teaching complete with pt. Pt understood all information and did not have any questions. Pt discharged home to family. 

## 2017-12-02 NOTE — Progress Notes (Signed)
Pt. Upset this AM with what she feels is lack of care and progression to d/c. Spoke with pt. Regarding yesterday's boarderline Bps and need for monitoring. Pt. Aware that her Bps were better of the PM shift. Pt. States she has children at home and needs a POC with discharge time. Requested to know when MD would round on her and send her home. Visitor in room agreed to stay and hear plan.  Call to Dr. Dareen Piano. Dr. Dareen Piano, stated Dr. Chestine Spore would be on and round on her around 8:30.  Pt. Updated.

## 2017-12-09 ENCOUNTER — Inpatient Hospital Stay (HOSPITAL_COMMUNITY)
Admission: AD | Admit: 2017-12-09 | Discharge: 2017-12-09 | Disposition: A | Discharge status: Home / Self Care | Payer: Medicaid Other | Source: Ambulatory Visit | Attending: Obstetrics and Gynecology | Admitting: Obstetrics and Gynecology

## 2017-12-09 ENCOUNTER — Encounter (HOSPITAL_COMMUNITY): Payer: Self-pay | Admitting: *Deleted

## 2017-12-09 DIAGNOSIS — O165 Unspecified maternal hypertension, complicating the puerperium: Secondary | ICD-10-CM | POA: Diagnosis not present

## 2017-12-09 DIAGNOSIS — R55 Syncope and collapse: Secondary | ICD-10-CM | POA: Insufficient documentation

## 2017-12-09 DIAGNOSIS — R42 Dizziness and giddiness: Secondary | ICD-10-CM | POA: Diagnosis present

## 2017-12-09 DIAGNOSIS — Z880 Allergy status to penicillin: Secondary | ICD-10-CM | POA: Diagnosis not present

## 2017-12-09 DIAGNOSIS — Z87891 Personal history of nicotine dependence: Secondary | ICD-10-CM | POA: Insufficient documentation

## 2017-12-09 HISTORY — DX: Gestational (pregnancy-induced) hypertension without significant proteinuria, unspecified trimester: O13.9

## 2017-12-09 LAB — URINALYSIS, ROUTINE W REFLEX MICROSCOPIC
BILIRUBIN URINE: NEGATIVE
Glucose, UA: NEGATIVE mg/dL
Ketones, ur: NEGATIVE mg/dL
NITRITE: NEGATIVE
Protein, ur: NEGATIVE mg/dL
SPECIFIC GRAVITY, URINE: 1.014 (ref 1.005–1.030)
WBC, UA: >50 WBC/hpf — ABNORMAL HIGH (ref 0–5)
pH: 5 (ref 5.0–8.0)

## 2017-12-09 LAB — COMPREHENSIVE METABOLIC PANEL
ALBUMIN: 4.2 g/dL (ref 3.5–5.0)
ALT: 17 U/L (ref 0–44)
ANION GAP: 10 (ref 5–15)
AST: 18 U/L (ref 15–41)
Alkaline Phosphatase: 94 U/L (ref 38–126)
BILIRUBIN TOTAL: 0.1 mg/dL — AB (ref 0.3–1.2)
BUN: 10 mg/dL (ref 6–20)
CO2: 26 mmol/L (ref 22–32)
Calcium: 9.5 mg/dL (ref 8.9–10.3)
Chloride: 102 mmol/L (ref 98–111)
Creatinine, Ser: 0.94 mg/dL (ref 0.44–1.00)
GFR calc non Af Amer: >60 mL/min (ref >60)
GLUCOSE: 86 mg/dL (ref 70–99)
POTASSIUM: 4.4 mmol/L (ref 3.5–5.1)
SODIUM: 138 mmol/L (ref 135–145)
TOTAL PROTEIN: 8.2 g/dL — AB (ref 6.5–8.1)

## 2017-12-09 LAB — CBC
HCT: 43.6 % (ref 36.0–46.0)
Hemoglobin: 13.9 g/dL (ref 12.0–15.0)
MCH: 28.5 pg (ref 26.0–34.0)
MCHC: 31.9 g/dL (ref 30.0–36.0)
MCV: 89.5 fL (ref 80.0–100.0)
Platelets: 428 10*3/uL — ABNORMAL HIGH (ref 150–400)
RBC: 4.87 MIL/uL (ref 3.87–5.11)
RDW: 14.2 % (ref 11.5–15.5)
WBC: 5 10*3/uL (ref 4.0–10.5)
nRBC: 0 % (ref 0.0–0.2)

## 2017-12-09 NOTE — MAU Provider Note (Addendum)
History     CSN: [id number]  Arrival date and time: 12/09/17 1550   First Provider Initiated Contact with Patient 12/09/17 1614      Chief Complaint  Patient presents with  . Dizziness   27 y.o. female 2 weeks postpartum here with near syncope. Reports standing in the BR this afternoon getting ready when she felt lightheaded. She then sat down and still felt lightheaded. No syncope. No N/V. She was dx with pp PEC and is taking Procardia and Labetalol. She was instructed to take her Labetalol dose at 10a instead of 6a, and it was decreased to bid from tid, today was the first day of this regimen. She also take Procardia 60 XL in am. She took both meds today. Denies SOB, CP, HA, visual disturbances, and RUQ pain. She is eating and drinking well.   OB History    Gravida  5   Para  3   Term  3   Preterm      AB  2   Living  3     SAB  1   TAB  1   Ectopic      Multiple  0   Live Births  3           Past Medical History:  Diagnosis Date  . Asthma   . Bronchitis   . Headache   . Pregnancy induced hypertension     Past Surgical History:  Procedure Laterality Date  . CESAREAN SECTION     breech and 2nd for RC/S  . CESAREAN SECTION N/A 11/23/2017   Procedure: REPEAT CESAREAN SECTION;  Surgeon: Marlow Baarslark, Dyanna, MD;  [facility];  Service: Obstetrics;  Laterality: N/A;  . NOSE SURGERY      Family History  Problem Relation Age of Onset  . Hypertension Maternal Grandmother     Social History   Tobacco Use  . Smoking status: Former Smoker    Packs/day: 0.50    Years: 5.00    Pack years: 2.50    Types: Cigarettes    Last attempt to quit: 03/14/2017    Years since quitting: 0.7  . Smokeless tobacco: Never Used  . Tobacco comment: states "need to"  Substance Use Topics  . Alcohol use: No    Frequency: Never  . Drug use: No    Allergies:  Allergies  Allergen Reactions  . Penicillins Rash and Other (See Comments)    Has patient had  a PCN reaction causing immediate rash, facial/tongue/throat swelling, SOB or lightheadedness with hypotension: Yes Has patient had a PCN reaction causing severe rash involving mucus membranes or skin necrosis: No Has patient had a PCN reaction that required hospitalization: No Has patient had a PCN reaction occurring within the last 10 years: Yes If all of the above answers are "NO", then may proceed with Cephalosporin use.     Medications Prior to Admission  Medication Sig Dispense Refill Last Dose  . albuterol (PROVENTIL HFA;VENTOLIN HFA) 108 (90 Base) MCG/ACT inhaler Inhale into the lungs every 6 (six) hours as needed for wheezing or shortness of breath.   Past Month at Unknown time  . docusate sodium (COLACE) 100 MG capsule Take 1 capsule (100 mg total) by mouth 2 (two) times daily. 60 capsule 0 11/29/2017 at Unknown time  . labetalol (NORMODYNE) 200 MG tablet Take 1 tablet (200 mg total) by mouth every 8 (eight) hours. 90 tablet 6   . NIFEdipine (ADALAT CC) 30 MG 24 hr tablet  Take 1 tablet (30 mg total) by mouth daily. 90 tablet 4   . oxyCODONE-acetaminophen (PERCOCET/ROXICET) 5-325 MG tablet Take 1-2 tablets by mouth every 6 (six) hours as needed for severe pain. 30 tablet 0 11/29/2017 at Unknown time  . Prenatal Vit-Fe Fumarate-FA (PRENATAL MULTIVITAMIN) TABS tablet Take 1 tablet by mouth daily at 12 noon.   Past Week at Unknown time    Review of Systems  Eyes: Negative for visual disturbance.  Respiratory: Negative for shortness of breath.   Cardiovascular: Negative for chest pain.  Gastrointestinal: Negative for abdominal pain.  Neurological: Positive for light-headedness. Negative for syncope and headaches.   Physical Exam   Blood pressure 123/66, pulse 62, temperature 97.9 F (36.6 C), temperature source Oral, resp. rate 18, SpO2 99 %, currently breastfeeding. Patient Vitals for the past 24 hrs:  BP Temp Temp src Pulse Resp SpO2  12/09/17 1835 123/66 - - 62 18 -  12/09/17  1610 - - - - - 99 %  12/09/17 1605 - - - - - 99 %  12/09/17 1600 - - - - - 99 %  12/09/17 1552 137/66 97.9 F (36.6 C) Oral 77 17 100 %   Orthostatic VS for the past 24 hrs:  BP- Lying Pulse- Lying BP- Sitting Pulse- Sitting BP- Standing at 0 minutes Pulse- Standing at 0 minutes  12/09/17 1635 - - - - 101/61 82  12/09/17 1634 - - 115/68 64 - -  12/09/17 1633 118/71 64 - - - -    Physical Exam  Constitutional: She is oriented to person, place, and time. She appears well-developed and well-nourished. No distress.  HENT:  Head: Normocephalic and atraumatic.  Neck: Normal range of motion.  Cardiovascular: Normal rate, regular rhythm and normal heart sounds.  Respiratory: Effort normal and breath sounds normal. No respiratory distress. She has no wheezes. She has no rales.  Musculoskeletal: Normal range of motion.  Neurological: She is alert and oriented to person, place, and time.  Skin: Skin is warm and dry.  Psychiatric: She has a normal mood and affect.   Results for orders placed or performed during the hospital encounter of 12/09/17 (from the past 24 hour(s))  CBC     Status: Abnormal   Collection Time: 12/09/17  4:56 PM  Result Value Ref Range   WBC 5.0 4.0 - 10.5 K/uL   RBC 4.87 3.87 - 5.11 MIL/uL   Hemoglobin 13.9 12.0 - 15.0 g/dL   HCT 81.1 91.4 - 78.2 %   MCV 89.5 80.0 - 100.0 fL   MCH 28.5 26.0 - 34.0 pg   MCHC 31.9 30.0 - 36.0 g/dL   RDW 95.6 21.3 - 08.6 %   Platelets 428 (H) 150 - 400 K/uL   nRBC 0.0 0.0 - 0.2 %  Comprehensive metabolic panel     Status: Abnormal   Collection Time: 12/09/17  4:56 PM  Result Value Ref Range   Sodium 138 135 - 145 mmol/L   Potassium 4.4 3.5 - 5.1 mmol/L   Chloride 102 98 - 111 mmol/L   CO2 26 22 - 32 mmol/L   Glucose, Bld 86 70 - 99 mg/dL   BUN 10 6 - 20 mg/dL   Creatinine, Ser 5.78 0.44 - 1.00 mg/dL   Calcium 9.5 8.9 - 46.9 mg/dL   Total Protein 8.2 (H) 6.5 - 8.1 g/dL   Albumin 4.2 3.5 - 5.0 g/dL   AST 18 15 - 41 U/L   ALT  17 0 - 44  U/L   Alkaline Phosphatase 94 38 - 126 U/L   Total Bilirubin 0.1 (L) 0.3 - 1.2 mg/dL   GFR calc non Af Amer >60 >60 mL/min   GFR calc Af Amer >60 >60 mL/min   Anion gap 10 5 - 15  Urinalysis, Routine w reflex microscopic     Status: Abnormal   Collection Time: 12/09/17  5:32 PM  Result Value Ref Range   Color, Urine YELLOW YELLOW   APPearance HAZY (A) CLEAR   Specific Gravity, Urine 1.014 1.005 - 1.030   pH 5.0 5.0 - 8.0   Glucose, UA NEGATIVE NEGATIVE mg/dL   Hgb urine dipstick LARGE (A) NEGATIVE   Bilirubin Urine NEGATIVE NEGATIVE   Ketones, ur NEGATIVE NEGATIVE mg/dL   Protein, ur NEGATIVE NEGATIVE mg/dL   Nitrite NEGATIVE NEGATIVE   Leukocytes, UA LARGE (A) NEGATIVE   RBC / HPF 21-50 0 - 5 RBC/hpf   WBC, UA >50 (H) 0 - 5 WBC/hpf   Bacteria, UA RARE (A) NONE SEEN   Squamous Epithelial / LPF 0-5 0 - 5   Mucus PRESENT    Non Squamous Epithelial 0-5 (A) NONE SEEN   MAU Course  Procedures  MDM Labs ordered and reviewed. Not orthostatic. Feels well now. Labs normal. Will hold Labetalol, continue Procardia. Stable for discharge home with close f/u tomorrow.  Assessment and Plan   1. Postpartum hypertension   2. Near syncope    Discharge home Follow up at [facility] tomorrow as scheduled Return precautions  Allergies as of 12/09/2017      Reactions   Penicillins Rash, Other (See Comments)   Has patient had a PCN reaction causing immediate rash, facial/tongue/throat swelling, SOB or lightheadedness with hypotension: Yes Has patient had a PCN reaction causing severe rash involving mucus membranes or skin necrosis: No Has patient had a PCN reaction that required hospitalization: No Has patient had a PCN reaction occurring within the last 10 years: Yes If all of the above answers are "NO", then may proceed with Cephalosporin use.      Medication List    TAKE these medications   albuterol 108 (90 Base) MCG/ACT inhaler Commonly known as:  PROVENTIL  HFA;VENTOLIN HFA Inhale into the lungs every 6 (six) hours as needed for wheezing or shortness of breath.   docusate sodium 100 MG capsule Commonly known as:  COLACE Take 1 capsule (100 mg total) by mouth 2 (two) times daily.   labetalol 200 MG tablet Commonly known as:  NORMODYNE Take 1 tablet (200 mg total) by mouth every 8 (eight) hours.   NIFEdipine 30 MG 24 hr tablet Commonly known as:  ADALAT CC Take 1 tablet (30 mg total) by mouth daily.   oxyCODONE-acetaminophen 5-325 MG tablet Commonly known as:  PERCOCET/ROXICET Take 1-2 tablets by mouth every 6 (six) hours as needed for severe pain.   prenatal multivitamin Tabs tablet Take 1 tablet by mouth daily at 12 noon.       Donette Larry, CNM 12/09/2017, 6:47 PM

## 2017-12-09 NOTE — MAU Note (Signed)
Pt came in by EMS, pt states she was standing & felt like she was going to pass out, head was spinning, dizzy.  Did not lose consciousness.  BP med recently changed, is on labetalol & nifedipine, dose was increased last week, and then was changed again yesterday.

## 2017-12-09 NOTE — Discharge Instructions (Signed)

## 2017-12-29 NOTE — Discharge Summary (Signed)
Obstetric Discharge Summary Reason for Admission: Elevated blood pressure Prenatal Procedures: NST and ultrasound Intrapartum Procedures: cesarean: low cervical, transverse Postpartum Procedures: none Complications-Operative and Postpartum: none Hemoglobin  Date Value Ref Range Status  12/09/2017 13.9 12.0 - 15.0 g/dL Final  86/57/846905/06/2017 62.911.6 11.1 - 15.9 g/dL Final   HCT  Date Value Ref Range Status  12/09/2017 43.6 36.0 - 46.0 % Final   Hematocrit  Date Value Ref Range Status  06/01/2017 35.5 34.0 - 46.6 % Final    Physical Exam:  General: alert, cooperative and appears stated age 48Lochia: appropriate Uterine Fundus: firm Incision: healing well DVT Evaluation: No evidence of DVT seen on physical exam.  Discharge Diagnoses: Term Pregnancy-delivered  Discharge Information: Date: 12/29/2017 Activity: pelvic rest Diet: routine Medications: Ibuprofen, Colace and Percocet Condition: improved Instructions: refer to practice specific booklet Discharge to: home   Newborn Data: Live born female  Birth Weight: 7 lb 1.6 oz (3220 g) APGAR: 8, 8  Newborn Delivery   Birth date/time:  11/23/2017 17:14:00 Delivery type:  C-Section, Low Transverse Trial of labor:  No C-section categorization:  Repeat     Home with mother.  Meagan ReedsKendra Ahmira Boisselle 12/29/2017, 11:52 AM

## 2018-02-25 ENCOUNTER — Ambulatory Visit: Payer: Medicaid Other | Admitting: Neurology

## 2018-02-25 ENCOUNTER — Telehealth: Payer: Self-pay | Admitting: Neurology

## 2018-02-25 ENCOUNTER — Other Ambulatory Visit: Payer: Self-pay

## 2018-02-25 ENCOUNTER — Encounter: Payer: Self-pay | Admitting: Neurology

## 2018-02-25 VITALS — BP 113/70 | HR 65 | Resp 14 | Ht 63.0 in | Wt 202.0 lb

## 2018-02-25 DIAGNOSIS — G4452 New daily persistent headache (NDPH): Secondary | ICD-10-CM | POA: Diagnosis not present

## 2018-02-25 DIAGNOSIS — G935 Compression of brain: Secondary | ICD-10-CM | POA: Diagnosis not present

## 2018-02-25 DIAGNOSIS — O1490 Unspecified pre-eclampsia, unspecified trimester: Secondary | ICD-10-CM | POA: Diagnosis not present

## 2018-02-25 DIAGNOSIS — R41840 Attention and concentration deficit: Secondary | ICD-10-CM

## 2018-02-25 NOTE — Progress Notes (Signed)
[facility]  PATIENT: Meagan Ross DOB: [birth year]  REFERRING DOCTOR OR PCP:  Lianne Moris PA-C SOURCE: Patient, notes from PCP, multiple hospital and emergency room notes, imaging results, CT scan personally reviewed.  _________________________________   HISTORICAL  CHIEF COMPLAINT:  Chief Complaint  Patient presents with  . Chiari Malformation    Rm. 12. [name] is here alone for eval of chiari malformation. Recent childbirth (c-section on 11/23/17). Had gestational HTN. Following delivery she c/o foggy thinking, light sensitivity, unable to handle stress the way she did before. "I can't comprehend, I just shut down.". Per pt., MRI done at [facility] in [place] shows chiari malformation, but we do not have images or report. Sts. recent normal opthalmology exam./fim    HISTORY OF PRESENT ILLNESS:  I had the pleasure seeing patient, Meagan Ross, [facility] for neurologic consultation regarding her decreased focus and attention and Chiari malformation found on CT.  She is a 28 year old woman who was noted to have a mild Chiari 1 Malformation on a CT scan performed 12/2017  to evaluate 'brain fog' that has persisted after being pre-eclamptic.   She delivered a baby November 23, 2017 (C-section).   Her BP's were elevated (SBP = 190) after delivery and she had headaches.   There were not signs of HELLP. She was treated with magnesium, labetalol and Procardia.    She started to note a 'brain fog' with her thinking being less clear and reduced focus/attention.    She is no longer on any medications and BP has been doing well.  However, her symptoms have persisted.    She notes lightheadedness since her pre-eclampsia episode as well as light sensitivity.   She denies any episodes of presyncope or syncope before or after the hospitalization.  No problems with headaches before the pre-eclampsia and now she just has a mild headaches.   The  headache is better if she lays on her left side.    She denies numbness or weakness.  She had spinal anesthesia for the C-section 11/23/2017.   She did not report any pain at the time.  She is sleeping well most nights with 6 hours of sleep on average.    She snores but has never been told that she has snoring or gasping.  REVIEW OF SYSTEMS: Constitutional: No fevers, chills, sweats, or change in appetite.  She notes fatigue Eyes: No visual changes, double vision, eye pain Ear, nose and throat: No hearing loss, ear pain, nasal congestion, sore throat Cardiovascular: No chest pain, palpitations Respiratory: No shortness of breath at rest or with exertion.   No wheezes GastrointestinaI: No nausea, vomiting, diarrhea, abdominal pain, fecal incontinence Genitourinary: No dysuria, urinary retention or frequency.  No nocturia. Musculoskeletal: No neck pain, back pain Integumentary: No rash, pruritus, skin lesions Neurological: as above Psychiatric: No depression at this time.  No anxiety Endocrine: No palpitations, diaphoresis, change in appetite, change in weigh or increased thirst Hematologic/Lymphatic: No anemia, purpura, petechiae. Allergic/Immunologic: No itchy/runny eyes, nasal congestion, recent allergic reactions, rashes  ALLERGIES: Allergies  Allergen Reactions  . Penicillins Rash and Other (See Comments)    Has patient had a PCN reaction causing immediate rash, facial/tongue/throat swelling, SOB or lightheadedness with hypotension: Yes Has patient had a PCN reaction causing severe rash involving mucus membranes or skin necrosis: No Has patient had a PCN reaction that required hospitalization: No Has patient had a PCN reaction occurring within the last 10 years: Yes If all of the  above answers are "NO", then may proceed with Cephalosporin use.     HOME MEDICATIONS:  Current Outpatient Medications:  .  cyanocobalamin 100 MCG tablet, Take 100 mcg by mouth daily., Disp: ,  Rfl:  .  Multiple Vitamin (MULTIVITAMIN) tablet, Take 1 tablet by mouth daily., Disp: , Rfl:   PAST MEDICAL HISTORY: Past Medical History:  Diagnosis Date  . Arnold-Chiari malformation (HCC)   . Asthma   . Bronchitis   . Headache   . Pregnancy induced hypertension     PAST SURGICAL HISTORY: Past Surgical History:  Procedure Laterality Date  . CESAREAN SECTION     breech and 2nd for RC/S  . CESAREAN SECTION N/A 11/23/2017   Procedure: REPEAT CESAREAN SECTION;  Surgeon: Marlow Baars, MD;  Location: [facility] BIRTHING SUITES;  Service: Obstetrics;  Laterality: N/A;  . NOSE SURGERY      FAMILY HISTORY: Family History  Problem Relation Age of Onset  . Hypertension Maternal Grandmother   . Healthy Sister   . Healthy Sister     SOCIAL HISTORY:  Social History   Socioeconomic History  . Marital status: Single    Spouse name: Not on file  . Number of children: 3  . Years of education: Not on file  . Highest education level: Not on file  Occupational History  . Occupation: Currently Unemployed  Social Needs  . Financial resource strain: Not hard at all  . Food insecurity:    Worry: Never true    Inability: Never true  . Transportation needs:    Medical: No    Non-medical: Not on file  Tobacco Use  . Smoking status: Former Smoker    Packs/day: 0.50    Years: 5.00    Pack years: 2.50    Types: Cigarettes    Last attempt to quit: 03/14/2017    Years since quitting: 0.9  . Smokeless tobacco: Never Used  . Tobacco comment: states "need to"  Substance and Sexual Activity  . Alcohol use: No    Frequency: Never  . Drug use: No  . Sexual activity: Not Currently    Birth control/protection: None, I.U.D.  Lifestyle  . Physical activity:    Days per week: Not on file    Minutes per session: Not on file  . Stress: Not at all  Relationships  . Social connections:    Talks on phone: Not on file    Gets together: Not on file    Attends religious service: Not on file     Active member of club or organization: Not on file    Attends meetings of clubs or organizations: Not on file    Relationship status: Not on file  . Intimate partner violence:    Fear of current or ex partner: No    Emotionally abused: No    Physically abused: No    Forced sexual activity: No  Other Topics Concern  . Not on file  Social History Narrative  . Not on file     PHYSICAL EXAM  Vitals:   02/25/18 0832  BP: 113/70  Pulse: 65  Resp: 14  Weight: 202 lb (91.6 kg)  Height: 5\' 3"  (1.6 m)    Body mass index is 35.78 kg/m.   General: The patient is well-developed and well-nourished and in no acute distress  Eyes:  Funduscopic exam shows normal optic discs and retinal vessels.  Neck: The neck is supple, no carotid bruits are noted.  The neck is slightly tender.  Cardiovascular: The heart has a regular rate and rhythm with a normal S1 and S2. There were no murmurs, gallops or rubs.   Skin: Extremities are without rash or edema.  Musculoskeletal:  Back is nontender  Neurologic Exam  Mental status: The patient is alert and oriented x 3 at the time of the examination. The patient has apparent normal recent and remote memory, with an apparently normal attention span and concentration ability.   Speech is normal.  Cranial nerves: Extraocular movements are full. Pupils are equal, round, and reactive to light and accomodation.  Visual fields are full.  Facial symmetry is present. There is good facial sensation to soft touch bilaterally.Facial strength is normal.  Trapezius and sternocleidomastoid strength is normal. No dysarthria is noted.  The tongue is midline, and the patient has symmetric elevation of the soft palate. No obvious hearing deficits are noted.  Motor:  Muscle bulk is normal.   Tone is normal. Strength is  5 / 5 in all 4 extremities.   Sensory: Sensory testing is intact to pinprick, soft touch and vibration sensation in all 4 extremities.  Coordination:  Cerebellar testing reveals good finger-nose-finger and heel-to-shin bilaterally.  Gait and station: Station is normal.   Gait is normal. Tandem gait is normal. Romberg is negative.   Reflexes: Deep tendon reflexes are symmetric and normal bilaterally.   Plantar responses are flexor.    DIAGNOSTIC DATA (LABS, IMAGING, TESTING) - I reviewed patient records, labs, notes, testing and imaging myself where available.  Lab Results  Component Value Date   WBC 5.0 12/09/2017   HGB 13.9 12/09/2017   HCT 43.6 12/09/2017   MCV 89.5 12/09/2017   PLT 428 (H) 12/09/2017      Component Value Date/Time   NA 138 12/09/2017 1656   K 4.4 12/09/2017 1656   CL 102 12/09/2017 1656   CO2 26 12/09/2017 1656   GLUCOSE 86 12/09/2017 1656   BUN 10 12/09/2017 1656   CREATININE 0.94 12/09/2017 1656   CALCIUM 9.5 12/09/2017 1656   PROT 8.2 (H) 12/09/2017 1656   ALBUMIN 4.2 12/09/2017 1656   AST 18 12/09/2017 1656   ALT 17 12/09/2017 1656   ALKPHOS 94 12/09/2017 1656   BILITOT 0.1 (L) 12/09/2017 1656   GFRNONAA >60 12/09/2017 1656   GFRAA >60 12/09/2017 1656       ASSESSMENT AND PLAN  Chiari I malformation (HCC) - Plan: MR BRAIN W WO CONTRAST  Pre-eclampsia, antepartum  Attention deficit - Plan: MR BRAIN W WO CONTRAST  New daily persistent headache - Plan: MR BRAIN W WO CONTRAST  In summary, Ms. Jenelle MagesHairston is a 28 year old woman who has noted persistent difficulties with focus and attention after an episode of preeclampsia with elevated blood pressures occurring a few days after delivery of her child.  She had spinal anesthesia.  1.    MRI of the brain with/without contrast to better assess the Chiari malformation and determine if there is any compression of the brainstem and also to assess for pachymeningeal thickening/enhancement that could be seen with intracranial hypotension. 2.    If pegging is seen from the Chiari Malformation, consider cervical spine MRI to assess for a syrinx and  possible referral to neurosurgery. 3.    Consider Buspar if anxiety persists. 4.    RTC prn  Joao Mccurdy A. Epimenio FootSater, MD, [facility],FAAN 02/25/2018, 8:34 AM Certified in Neurology, Clinical Neurophysiology, Sleep Medicine, Pain Medicine and Neuroimaging  [facility] [address], [address] [place], [place] [id number] [phone]  [phone] 

## 2018-02-25 NOTE — Telephone Encounter (Signed)
medicaid order sent to GI pt is aware

## 2018-03-01 ENCOUNTER — Other Ambulatory Visit: Payer: Self-pay | Admitting: Neurology

## 2018-03-02 NOTE — Telephone Encounter (Signed)
[name]: [id number] (exp. 03/01/18 to 03/31/18) patient is scheduled at GI for 03/03/18.

## 2018-03-03 ENCOUNTER — Ambulatory Visit
Admission: RE | Admit: 2018-03-03 | Discharge: 2018-03-03 | Disposition: A | Discharge status: Home / Self Care | Payer: Medicaid Other | Source: Ambulatory Visit | Attending: Neurology | Admitting: Neurology

## 2018-03-03 DIAGNOSIS — R41840 Attention and concentration deficit: Secondary | ICD-10-CM

## 2018-03-03 DIAGNOSIS — G935 Compression of brain: Secondary | ICD-10-CM

## 2018-03-03 DIAGNOSIS — G4452 New daily persistent headache (NDPH): Secondary | ICD-10-CM

## 2018-03-03 MED ORDER — GADOBENATE DIMEGLUMINE 529 MG/ML IV SOLN
20.0000 mL | Freq: Once | INTRAVENOUS | Status: AC | PRN
  Administered 2018-03-03: 20 mL via INTRAVENOUS

## 2018-03-04 ENCOUNTER — Telehealth: Payer: Self-pay | Admitting: *Deleted

## 2018-03-04 DIAGNOSIS — J328 Other chronic sinusitis: Secondary | ICD-10-CM

## 2018-03-04 MED ORDER — AZITHROMYCIN 250 MG PO TABS: ORAL_TABLET | ORAL | 0 refills | Status: DC

## 2018-03-04 NOTE — Telephone Encounter (Signed)
Pt called back. Relayed results per Dr. Epimenio FootSater note. Pt verbalized understanding. She is having sinusitis sx and would like zpak called in. I verified pharmacy on file. I escribed rx.   She asked what should be done about her anxiety. I advised per last OV note that Dr. Epimenio FootSater recommended buspar and went over what this medication was. She is going to research and see if this is something she is interested in trying. Advised she can also try relaxation techniques/consider seeing counselor. They may help provide her with techniques/coping mechanisms to help with her anxiety.

## 2018-03-04 NOTE — Telephone Encounter (Signed)
Tried calling pt. Unable to reach pt.

## 2018-03-04 NOTE — Telephone Encounter (Signed)
-----   Message from Asa Lenteichard A Sater, MD sent at 03/03/2018  5:43 PM EST ----- Please let her know that the MRI of the brain looked good.  As was seen in the CT scan, the bottom of the cerebellum is a little bit low but it does not look any worse than the CAT scan and is not bad enough but this is mild and no intervention is necessary.   She did have mild chronic sinusitis.  If she has any sinusitis symptoms, we can call in a Z-Pak.

## 2018-03-17 ENCOUNTER — Ambulatory Visit: Payer: Medicaid Other | Admitting: Neurology

## 2018-06-16 ENCOUNTER — Telehealth: Payer: Self-pay | Admitting: Neurology

## 2018-06-16 NOTE — Telephone Encounter (Signed)
error 

## 2018-06-22 ENCOUNTER — Other Ambulatory Visit: Payer: Self-pay

## 2018-06-22 ENCOUNTER — Ambulatory Visit (INDEPENDENT_AMBULATORY_CARE_PROVIDER_SITE_OTHER): Payer: Medicaid Other | Admitting: Family Medicine

## 2018-06-22 ENCOUNTER — Encounter: Payer: Self-pay | Admitting: Family Medicine

## 2018-06-22 DIAGNOSIS — G935 Compression of brain: Secondary | ICD-10-CM

## 2018-06-22 DIAGNOSIS — J328 Other chronic sinusitis: Secondary | ICD-10-CM | POA: Diagnosis not present

## 2018-06-22 NOTE — Progress Notes (Signed)
PATIENT: Meagan Ross DOB: [birth year]  REASON FOR VISIT: follow up HISTORY FROM: patient  Virtual Visit via Telephone Note  I connected with Meagan Ross on 06/22/18 at  8:00 AM EDT by telephone and verified that I am speaking with the correct person using two identifiers.   I discussed the limitations, risks, security and privacy concerns of performing an evaluation and management service by telephone and the availability of in person appointments. I also discussed with the patient that there may be a patient responsible charge related to this service. The patient expressed understanding and agreed to proceed.   History of Present Illness:  06/22/18 Meagan Ross is a 28 y.o. female for follow up of chiari I malformation discovered in 12/2017. She delivered a healthy baby girl via c-section in 10/2017. Pregnancy was complicated by preeclampsia but otherwise normal. She complained of fogginess and pressure behind her eyes with decreased concentration leading to CT of head. She was seen by Dr Epimenio Foot in 01/2018 and MRI confirmed mild Chiari I malformation. She reports that since being seen by Dr Epimenio Foot, symptoms of decreased concentration, fogginess and headaches have improved some. She continues to have an intermittent pressure or tightness in the frontal region. She denies pain. She rarely checks her blood pressure but feels that it has been ok. She remembers one reading of 130's/80's. She is not taking BP meds due to concerns of hypotension. She has been told that she snores. She goes to bed between 9pm and 12pm and usually wakes around 5am-6am. She did take azithromycin after MRI revealed sinusitis but did not feel that it helped. She has allergy symptoms throughout the year.    HISTORY (copied from Dr Bonnita Hollow note on 02/25/2018)   I had the pleasure seeing patient, Meagan Ross, [facility] for neurologic consultation regarding her decreased focus and  attention and Chiari malformation found on CT.  She is a 28 year old woman who was noted to have a mild Chiari 1 Malformation on a CT scan performed 12/2017  to evaluate 'brain fog' that has persisted after being pre-eclamptic.   She delivered a baby November 23, 2017 (C-section).   Her BP's were elevated (SBP = 190) after delivery and she had headaches.   There were not signs of HELLP. She was treated with magnesium, labetalol and Procardia.    She started to note a 'brain fog' with her thinking being less clear and reduced focus/attention.    She is no longer on any medications and BP has been doing well.  However, her symptoms have persisted.    She notes lightheadedness since her pre-eclampsia episode as well as light sensitivity.   She denies any episodes of presyncope or syncope before or after the hospitalization.  No problems with headaches before the pre-eclampsia and now she just has a mild headaches.   The headache is better if she lays on her left side.    She denies numbness or weakness.  She had spinal anesthesia for the C-section 11/23/2017.   She did not report any pain at the time.  She is sleeping well most nights with 6 hours of sleep on average.    She snores but has never been told that she has snoring or gasping.  Observations/Objective:  Generalized: Well developed, in no acute distress  Mentation: Alert oriented to time, place, history taking. Follows all commands speech and language fluent   Assessment and Plan:  28 y.o. year old female  has a past medical  history of Arnold-Chiari malformation (HCC), Asthma, Bronchitis, Headache, and Pregnancy induced hypertension. here with    ICD-10-CM   1. Chiari I malformation (HCC) G93.5   2. Other chronic sinusitis J32.8    She has noted improvement of fogginess and decreased concentration since last being seen in 01/2018. She continues to have intermittent pressure behind her eyes and frontal region of head. MRI showed mild  Chiari I. She has not checked BP regularly. She has not been seen by PCP. I have advised that she take a daily BP log and document readings for us to review in 2 weeks. I also advised that she start an OTC antihistamine and Mucinex for allergy symptoms. I would like for her to exercise daily and stay well hydrated. We have discussed low sodium diet. We will follow up in 2 weeks to assess BP log and symptoms following initiation of antihistamine. I would like her to schedule follow up with PCP as well. She verbalizes agreement and understanding with this plan.   No orders of the defined types were placed in this encounter.   No orders of the defined types were placed in this encounter.    Follow Up Instructions:  I discussed the assessment and treatment plan with the patient. The patient was provided an opportunity to ask questions and all were answered. The patient agreed with the plan and demonstrated an understanding of the instructions.   The patient was advised to call back or seek an in-person evaluation if the symptoms worsen or if the condition fails to improve as anticipated.  I provided 20 minutes of non-face-to-face time during this encounter.  Patient is located at her place of residence during video conference.  Provider is located at her place of residence.  Alverda SkeansSandy Young, RN helped to facilitate visit.   Shawnie DapperAmy Zelie Asbill, NP

## 2018-06-22 NOTE — Progress Notes (Signed)
I have read the note, and I agree with the clinical assessment and plan.  Tyffany Waldrop A. Elsi Stelzer, MD, PhD, FAAN Certified in Neurology, Clinical Neurophysiology, Sleep Medicine, Pain Medicine and Neuroimaging  [facility] [address], [address] [place], [place] [address] [phone]  

## 2018-06-29 ENCOUNTER — Telehealth: Payer: Self-pay

## 2018-06-29 NOTE — Telephone Encounter (Signed)
Spoke with the patient and they have given verbal consent to file their insurance and to do a doxy.me visit. Mobile number and carrier have been confirmed and sent.   Text: [phone] ([employer])

## 2018-07-06 ENCOUNTER — Encounter: Payer: Self-pay | Admitting: Family Medicine

## 2018-07-06 ENCOUNTER — Ambulatory Visit (INDEPENDENT_AMBULATORY_CARE_PROVIDER_SITE_OTHER): Payer: Medicaid Other | Admitting: Family Medicine

## 2018-07-06 ENCOUNTER — Other Ambulatory Visit: Payer: Self-pay

## 2018-07-06 DIAGNOSIS — G935 Compression of brain: Secondary | ICD-10-CM

## 2018-07-06 NOTE — Progress Notes (Signed)
I have read the note, and I agree with the clinical assessment and plan.  Nikolette Reindl A. Roshawn Ayala, MD, PhD, FAAN Certified in Neurology, Clinical Neurophysiology, Sleep Medicine, Pain Medicine and Neuroimaging  [facility] [address], [address] [place], [place] [address] [phone]  

## 2018-07-06 NOTE — Progress Notes (Signed)
   PATIENT: Meagan Ross DOB: 11-26-1990  REASON FOR VISIT: follow up HISTORY FROM: patient  Virtual Visit via Telephone Note  I connected with Meagan Ross on 07/06/18 at 10:30 AM EDT by telephone and verified that I am speaking with the correct person using two identifiers.   I discussed the limitations, risks, security and privacy concerns of performing an evaluation and management service by telephone and the availability of in person appointments. I also discussed with the patient that there may be a patient responsible charge related to this service. The patient expressed understanding and agreed to proceed.   History of Present Illness:  07/06/18 Meagan Ross is a 28 y.o. female here today for follow up of headaches and Chiari I malformation. She reports that since her last visit she has monitored her blood pressures regularly.  Most readings are between 1 51-7 20 systolic over 61-60 diastolic.  She has had 1 isolated blood pressure reading of 151/7.  She states that she did have blurry vision and increased anxiety which prompted her to check her pressure.  This typically occurs about once a week.  She has not checked her blood pressure during any other events.  She did start Mucinex as directed on packaging.  She does feel that this is significantly helped her headaches.  She did not start antihistamine therapy.  She does feel that anxiety continues to play a role in her headaches but feels this is also much improved.     Observations/Objective:  Generalized: Well developed, in no acute distress  Mentation: Alert oriented to time, place, history taking. Follows all commands speech and language fluent   Assessment and Plan:  28 y.o. year old female  has a past medical history of Arnold-Chiari malformation ([place]), Asthma, Bronchitis, Headache, and Pregnancy induced hypertension.  With    ICD-10-CM   1. Chiari I malformation ([place]) G93.5    She is doing well since  her last visit 2 weeks ago.  After initiation of Mucinex she reports that her headaches have improved significantly.  She was advised to start antihistamine therapy as well.  We have reviewed imaging and discussed Chiari malformation.  No intervention needed at this time.  Blood pressures appear to be stable, however, she does have isolated events where her blood pressure is elevated.  I have advised that she discuss this with her primary care provider as well as concerns of anxiety.  I have recommended consideration of either Effexor for anxiety and depression or metoprolol for blood pressure should these concerns continue.  She verbalizes understanding and agreement with this plan.  We will follow-up in 6 months.  No orders of the defined types were placed in this encounter.   No orders of the defined types were placed in this encounter.    Follow Up Instructions:  I discussed the assessment and treatment plan with the patient. The patient was provided an opportunity to ask questions and all were answered. The patient agreed with the plan and demonstrated an understanding of the instructions.   The patient was advised to call back or seek an in-person evaluation if the symptoms worsen or if the condition fails to improve as anticipated.  I provided 15 minutes of non-face-to-face time during this encounter.  Patient is located at her place of residence during video conference.  Provider is located in the office.  Maryelizabeth Kaufmann, CMA helped to facilitate visit.   Debbora Presto, NP

## 2018-12-15 ENCOUNTER — Telehealth: Payer: Self-pay | Admitting: Family Medicine

## 2018-12-15 NOTE — Telephone Encounter (Signed)
Pt is scheduled to have her wisdom teeth removed tomorrow at 7am, she is asking if NP Amy will give the okay for that.  Pt is asking if she can get a call back today about this

## 2018-12-15 NOTE — Telephone Encounter (Signed)
Baring unforseen circumstances, I do not have any reason to have concerns from a neurological perspective. She may need to check with PCP if any other medical concerns may prevent extraction.

## 2018-12-15 NOTE — Telephone Encounter (Signed)
I called pt and relayed that from a neurological standpoint she is ok to proceed for wisdom teeth extraction.  She also wanted to proceed with Sleep consult which she states was mentioned in previous notes.  She does have  appt 01-05-19 but wanted to go ahead and send message to you.

## 2018-12-15 NOTE — Telephone Encounter (Signed)
Will discuss at f/u. TY.

## 2018-12-16 NOTE — Telephone Encounter (Signed)
I called pt and let her know that per AL/NP that she will address when in on the 01-05-19 appt.

## 2019-01-05 ENCOUNTER — Ambulatory Visit: Payer: Medicaid Other | Admitting: Family Medicine

## 2019-03-02 ENCOUNTER — Encounter: Payer: Self-pay | Admitting: Family Medicine

## 2019-03-02 ENCOUNTER — Ambulatory Visit: Payer: Medicaid Other | Admitting: Family Medicine

## 2019-03-02 NOTE — Progress Notes (Deleted)
PATIENT: Meagan Ross DOB: 1990/09/10  REASON FOR VISIT: follow up HISTORY FROM: patient  No chief complaint on file.    HISTORY OF PRESENT ILLNESS: Today 03/02/19 Meagan Ross is a 29 y.o. female here today for follow up.   HISTORY: (copied from my note on 07/06/2018)  Meagan Ross is a 29 y.o. female here today for follow up of headaches and Chiari I malformation. She reports that since her last visit she has monitored her blood pressures regularly.  Most readings are between 1 15-1 20 systolic over 60-70 diastolic.  She has had 1 isolated blood pressure reading of 151/7.  She states that she did have blurry vision and increased anxiety which prompted her to check her pressure.  This typically occurs about once a week.  She has not checked her blood pressure during any other events.  She did start Mucinex as directed on packaging.  She does feel that this is significantly helped her headaches.  She did not start antihistamine therapy.  She does feel that anxiety continues to play a role in her headaches but feels this is also much improved.   REVIEW OF SYSTEMS: Out of a complete 14 system review of symptoms, the patient complains only of the following symptoms, and all other reviewed systems are negative.  ALLERGIES: Allergies  Allergen Reactions  . Penicillins Rash and Other (See Comments)    Has patient had a PCN reaction causing immediate rash, facial/tongue/throat swelling, SOB or lightheadedness with hypotension: Yes Has patient had a PCN reaction causing severe rash involving mucus membranes or skin necrosis: No Has patient had a PCN reaction that required hospitalization: No Has patient had a PCN reaction occurring within the last 10 years: Yes If all of the above answers are "NO", then may proceed with Cephalosporin use.     HOME MEDICATIONS: Outpatient Medications Prior to Visit  Medication Sig Dispense Refill  . azithromycin (ZITHROMAX) 250 MG tablet  Take 2 tablets on day 1 and then taper down by one tablet each day until finished 6 each 0  . cyanocobalamin 100 MCG tablet Take 100 mcg by mouth daily.    . Multiple Vitamin (MULTIVITAMIN) tablet Take 1 tablet by mouth daily.     No facility-administered medications prior to visit.    PAST MEDICAL HISTORY: Past Medical History:  Diagnosis Date  . Arnold-Chiari malformation (HCC)   . Asthma   . Bronchitis   . Headache   . Pregnancy induced hypertension     PAST SURGICAL HISTORY: Past Surgical History:  Procedure Laterality Date  . CESAREAN SECTION     breech and 2nd for RC/S  . CESAREAN SECTION N/A 11/23/2017   Procedure: REPEAT CESAREAN SECTION;  Surgeon: Marlow Baars, MD;  Location: [facility] SU[facility];  Service: Obstetrics;  Laterality: N/A;  . NOSE SURGERY      FAMILY HISTORY: Family History  Problem Relation Age of Onset  . Hypertension Maternal Grandmother   . Healthy Sister   . Healthy Sister     SOCIAL HISTORY: Social History   Socioeconomic History  . Marital status: Single    Spouse name: Not on file  . Number of children: 3  . Years of education: Not on file  . Highest education level: Not on file  Occupational History  . Occupation: Currently Unemployed  Tobacco Use  . Smoking status: Former Smoker    Packs/day: 0.50    Years: 5.00    Pack years: 2.50    Types: Cigarettes  Quit date: 03/14/2017    Years since quitting: 1.9  . Smokeless tobacco: Never Used  . Tobacco comment: states "need to"  Substance and Sexual Activity  . Alcohol use: No  . Drug use: No  . Sexual activity: Not Currently    Birth control/protection: None, I.U.D.  Other Topics Concern  . Not on file  Social History Narrative  . Not on file   Social Determinants of Health   Financial Resource Strain:   . Difficulty of Paying Living Expenses: Not on file  Food Insecurity:   . Worried About Charity fundraiser in the Last Year: Not on file  . Ran Out of Food in the  Last Year: Not on file  Transportation Needs:   . Lack of Transportation (Medical): Not on file  . Lack of Transportation (Non-Medical): Not on file  Physical Activity:   . Days of Exercise per Week: Not on file  . Minutes of Exercise per Session: Not on file  Stress:   . Feeling of Stress : Not on file  Social Connections:   . Frequency of Communication with Friends and Family: Not on file  . Frequency of Social Gatherings with Friends and Family: Not on file  . Attends Religious Services: Not on file  . Active Member of Clubs or Organizations: Not on file  . Attends Archivist Meetings: Not on file  . Marital Status: Not on file  Intimate Partner Violence:   . Fear of Current or Ex-Partner: Not on file  . Emotionally Abused: Not on file  . Physically Abused: Not on file  . Sexually Abused: Not on file      PHYSICAL EXAM  There were no vitals filed for this visit. There is no height or weight on file to calculate BMI.  Generalized: Well developed, in no acute distress  Cardiology: normal rate and rhythm, no murmur noted Neurological examination  Mentation: Alert oriented to time, place, history taking. Follows all commands speech and language fluent Cranial nerve II-XII: Pupils were equal round reactive to light. Extraocular movements were full, visual field were full on confrontational test. Facial sensation and strength were normal. Uvula tongue midline. Head turning and shoulder shrug  were normal and symmetric. Motor: The motor testing reveals 5 over 5 strength of all 4 extremities. Good symmetric motor tone is noted throughout.  Sensory: Sensory testing is intact to soft touch on all 4 extremities. No evidence of extinction is noted.  Coordination: Cerebellar testing reveals good finger-nose-finger and heel-to-shin bilaterally.  Gait and station: Gait is normal. Tandem gait is normal. Romberg is negative. No drift is seen.  Reflexes: Deep tendon reflexes are  symmetric and normal bilaterally.   DIAGNOSTIC DATA (LABS, IMAGING, TESTING) - I reviewed patient records, labs, notes, testing and imaging myself where available.  No flowsheet data found.   Lab Results  Component Value Date   WBC 5.0 12/09/2017   HGB 13.9 12/09/2017   HCT 43.6 12/09/2017   MCV 89.5 12/09/2017   PLT 428 (H) 12/09/2017      Component Value Date/Time   NA 138 12/09/2017 1656   K 4.4 12/09/2017 1656   CL 102 12/09/2017 1656   CO2 26 12/09/2017 1656   GLUCOSE 86 12/09/2017 1656   BUN 10 12/09/2017 1656   CREATININE 0.94 12/09/2017 1656   CALCIUM 9.5 12/09/2017 1656   PROT 8.2 (H) 12/09/2017 1656   ALBUMIN 4.2 12/09/2017 1656   AST 18 12/09/2017 1656   ALT  17 12/09/2017 1656   ALKPHOS 94 12/09/2017 1656   BILITOT 0.1 (L) 12/09/2017 1656   GFRNONAA >60 12/09/2017 1656   GFRAA >60 12/09/2017 1656   No results found for: CHOL, HDL, LDLCALC, LDLDIRECT, TRIG, CHOLHDL No results found for: TUYW0B No results found for: VITAMINB12 No results found for: TSH     ASSESSMENT AND PLAN 29 y.o. year old female  has a past medical history of Arnold-Chiari malformation (HCC), Asthma, Bronchitis, Headache, and Pregnancy induced hypertension. here with ***    ICD-10-CM   1. Chiari I malformation (HCC)  G93.5        No orders of the defined types were placed in this encounter.    No orders of the defined types were placed in this encounter.     I spent 15 minutes with the patient. 50% of this time was spent counseling and educating patient on plan of care and medications.    Shawnie Dapper, FNP-C 03/02/2019, 1:07 PM [facility] [address] [place], [place] [address] [phone]

## 2019-03-05 IMAGING — US US OB < 14 WEEKS - US OB TV
1 series · 15 of 28 positions shown · non-contrast
Comparison: None

CLINICAL DATA: Abdominal pain affecting first trimester of
pregnancy, LEFT side pain for 2 days, 4 weeks 4 days EGA by LMP,

EXAM:
OBSTETRIC <14 WK US AND TRANSVAGINAL OB US
TECHNIQUE: Both transabdominal and transvaginal ultrasound examinations were
performed for complete evaluation of the gestation as well as the
maternal uterus, adnexal regions, and pelvic cul-de-sac.
Transvaginal technique was performed to assess early pregnancy.

[Series 1: us ob < 14 weeks - us ob tv · 73 acquisitions, 15 frames shown]
[im 1/73]
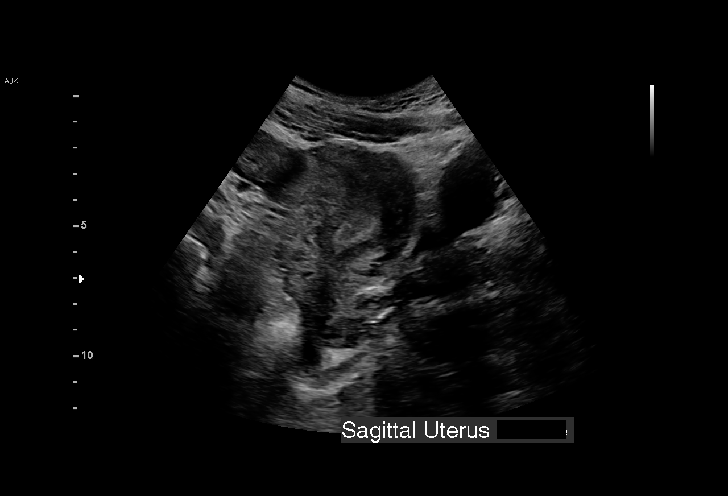
[im 6/73]
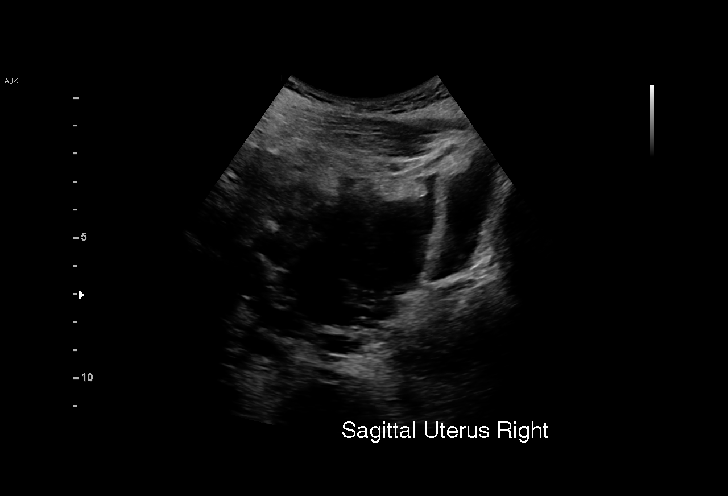
[im 11/73]
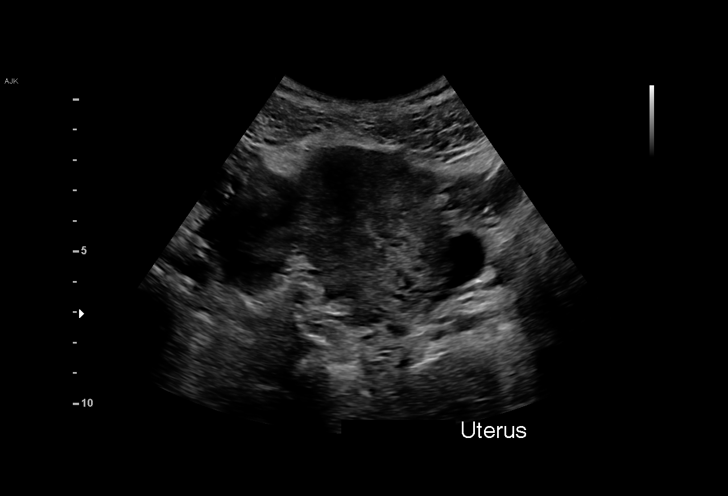
[im 17/73]
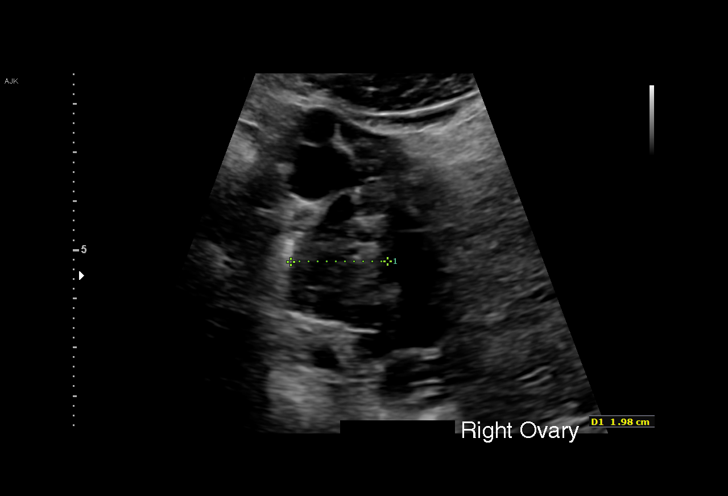
[im 22/73]
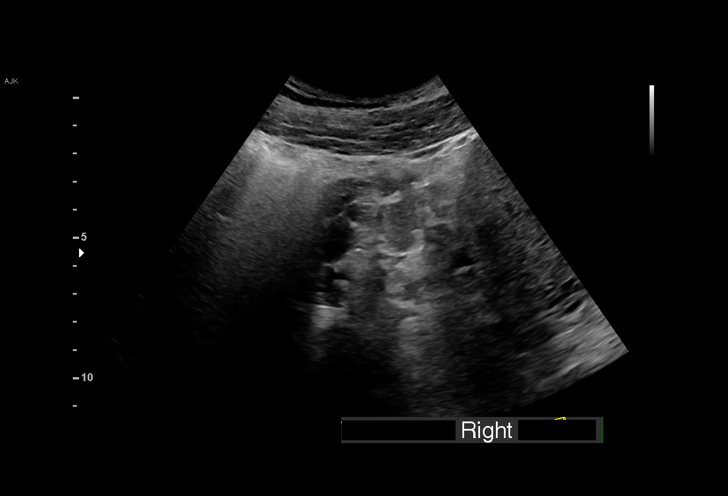
[im 27/73]
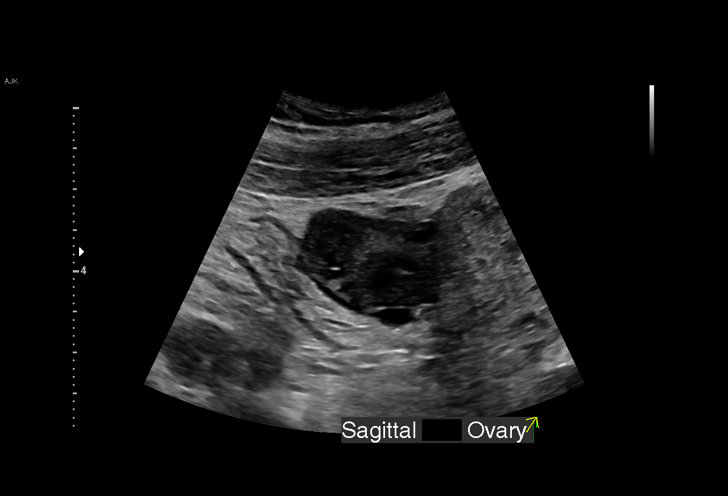
[im 33/73]
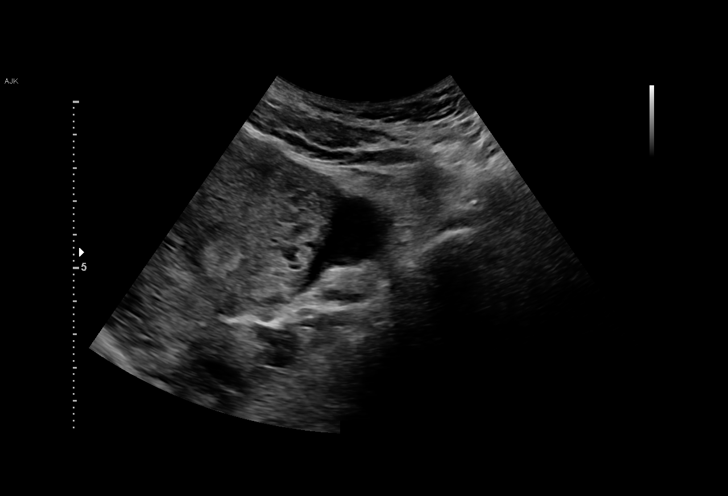
[im 38/73]
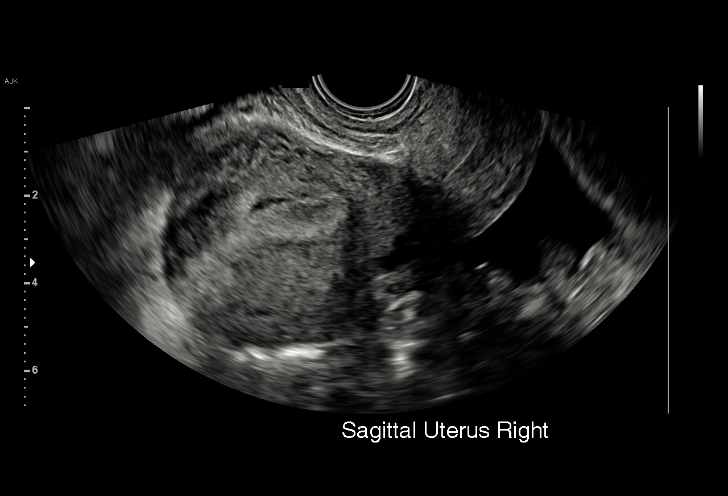
[im 41/73]
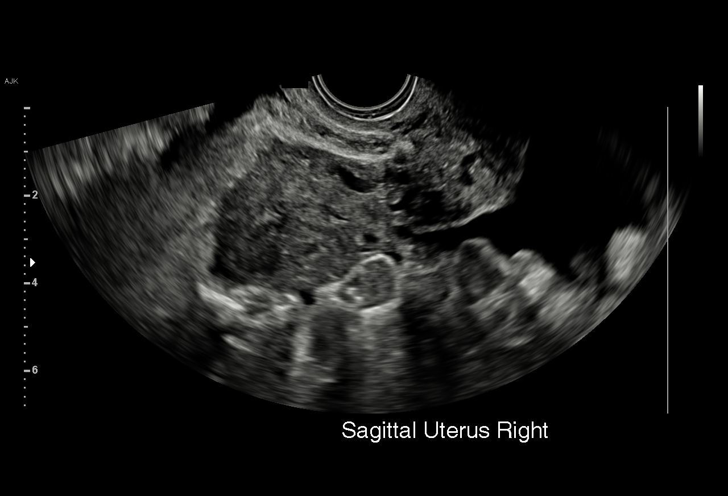
[im 46/73]
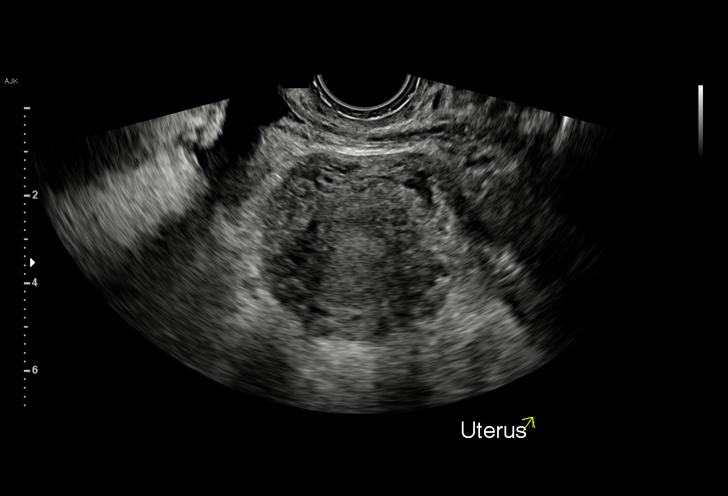
[im 51/73]
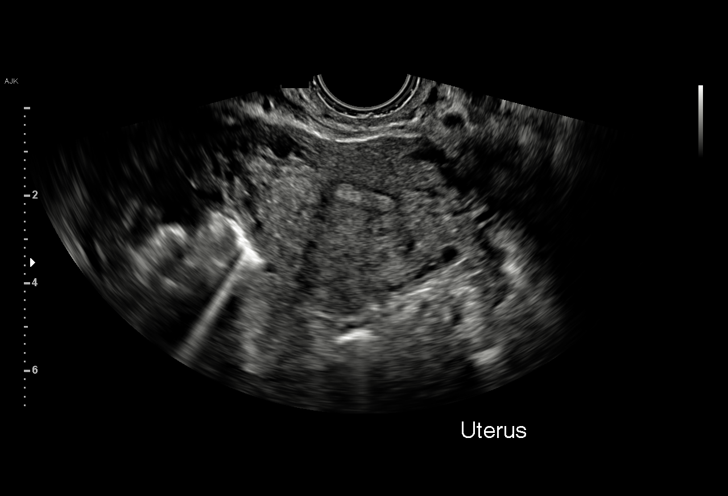
[im 57/73]
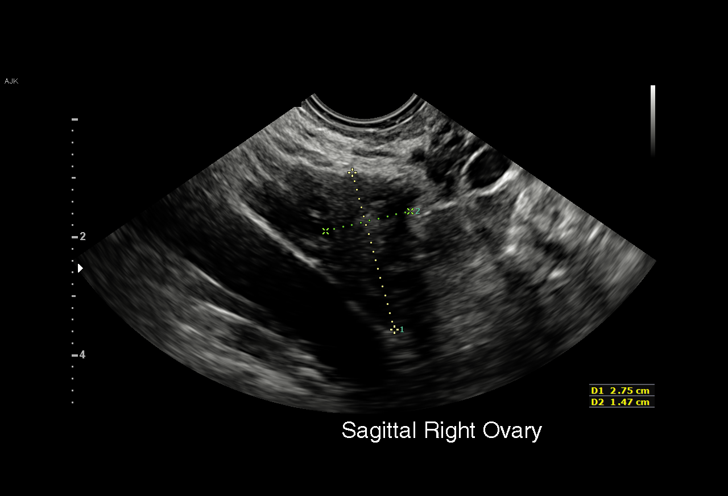
[im 62/73]
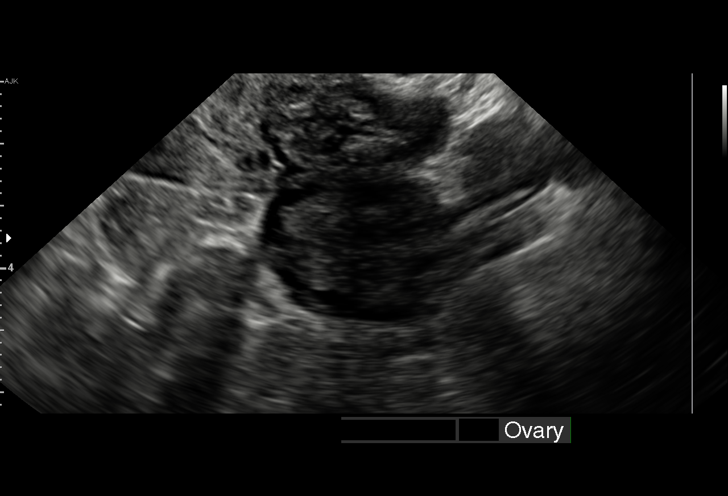
[im 67/73]
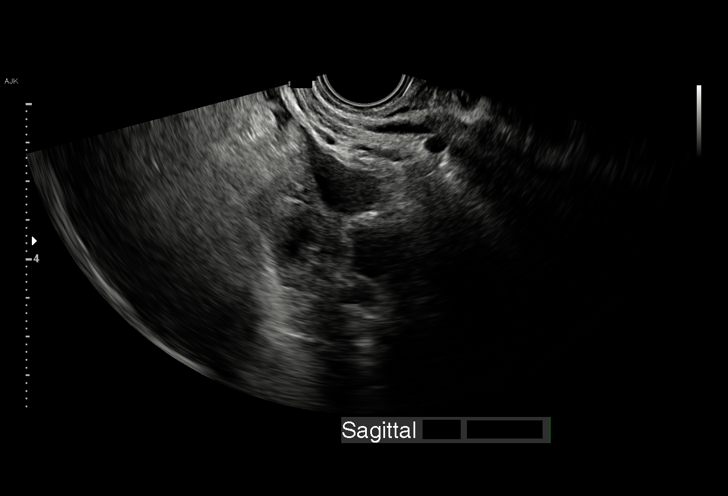
[im 73/73]
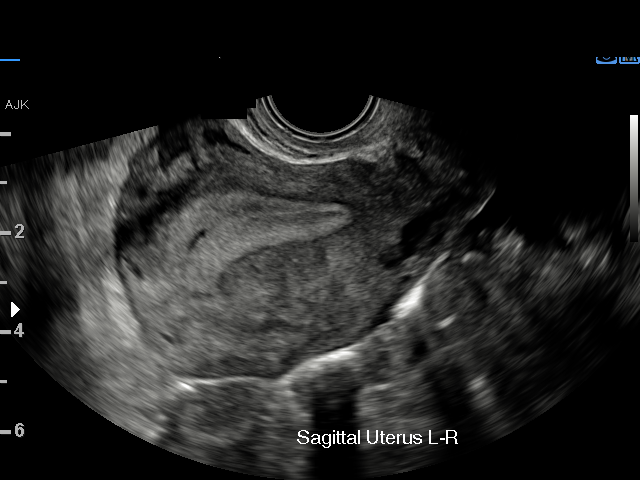

[15 of 28 positions shown; findings below may reference images not displayed]

FINDINGS: Intrauterine gestational sac: Potential tiny eccentric gestional sac

Yolk sac:  Absent

Embryo:  Absent

Cardiac Activity: N/A

Heart Rate: N/A  bpm

Subchorionic hemorrhage:  None visualized.

Maternal uterus/adnexae:

No uterine mass.

LEFT ovary normal size and morphology 3.8 x 2.7 x 3.4 cm.

RIGHT ovary normal size and morphology 2.8 x 1.5 x 1.9 cm.

Small amount of simple appearing free fluid.

No adnexal masses.
IMPRESSION: Tiny fluid collection eccentric in within the endometrium at the
upper uterine segment, not definitive but could potentially
represent a tiny gestational sac.

Small amount of nonspecific simple appearing free pelvic fluid.

Recommend follow-up ultrasound in 10-14 days to confirm IUP and
exclude ectopic pregnancy.

## 2019-05-04 ENCOUNTER — Ambulatory Visit: Payer: Medicaid Other | Admitting: Family Medicine

## 2019-05-16 ENCOUNTER — Telehealth: Payer: Self-pay | Admitting: Family Medicine

## 2019-05-16 NOTE — Telephone Encounter (Signed)
Please let her know that I did advise that she consider Effexor if she felt that anxiety was contributing to headaches. This should be managed by PCP. Have her reach out to PCP for consideration and management if needing for anxiety. TY.

## 2019-05-16 NOTE — Telephone Encounter (Signed)
I tried to call the pt but her VM was full.

## 2019-05-16 NOTE — Telephone Encounter (Signed)
Pt called wanting to know if she can still get something prescribed to her for her anxiety that was offered to her in her last appt. Please advise.

## 2019-05-17 NOTE — Telephone Encounter (Signed)
I tried to reach the pt again on her mobile #. VM full again.

## 2019-05-18 NOTE — Telephone Encounter (Signed)
I spoke with the pt and discussed message below from Amy. Pt verbalized understanding. She has an appt tomorrow with PCP and will discuss her questions about the Effexor with them.

## 2019-09-13 ENCOUNTER — Encounter (HOSPITAL_COMMUNITY): Payer: Self-pay | Admitting: Emergency Medicine

## 2019-09-13 ENCOUNTER — Ambulatory Visit (HOSPITAL_COMMUNITY)
Admission: EM | Admit: 2019-09-13 | Discharge: 2019-09-13 | Disposition: A | Discharge status: Home / Self Care | Payer: Medicaid Other | Attending: Family Medicine | Admitting: Family Medicine

## 2019-09-13 ENCOUNTER — Other Ambulatory Visit: Payer: Self-pay

## 2019-09-13 DIAGNOSIS — Z20822 Contact with and (suspected) exposure to covid-19: Secondary | ICD-10-CM | POA: Insufficient documentation

## 2019-09-13 DIAGNOSIS — J069 Acute upper respiratory infection, unspecified: Secondary | ICD-10-CM | POA: Insufficient documentation

## 2019-09-13 LAB — SARS CORONAVIRUS 2 (TAT 6-24 HRS): SARS Coronavirus 2: NEGATIVE

## 2019-09-13 MED ORDER — ALBUTEROL SULFATE (2.5 MG/3ML) 0.083% IN NEBU: 2.5000 mg | INHALATION_SOLUTION | Freq: Four times a day (QID) | RESPIRATORY_TRACT | 0 refills | Status: DC | PRN

## 2019-09-13 MED ORDER — ALBUTEROL SULFATE HFA 108 (90 BASE) MCG/ACT IN AERS: 1.0000 | INHALATION_SPRAY | Freq: Four times a day (QID) | RESPIRATORY_TRACT | 0 refills | Status: DC | PRN

## 2019-09-13 NOTE — Discharge Instructions (Addendum)
Go home to rest Drink plenty of fluids Take Tylenol for pain or fever You may take over-the-counter cough and cold medicines as needed You must quarantine at home until your test result is available You can check for your test result in MyChart  

## 2019-09-13 NOTE — ED Provider Notes (Signed)
[facility]    CSN: [id number] Arrival date & time: 09/13/19  7616      History   Chief Complaint Chief Complaint  Patient presents with  . Covid Exposure    HPI Meagan Ross is a 29 y.o. female.   HPI  Patient states she has been exposed to Covid.  She states that she started having symptoms last night of headache, body aches, fever and chills, perspiration, nausea nausea and anxiety.  No diarrhea.  No cough or shortness of breath.  No loss of taste or smell.  She states she has had to take her anxiety medication to deal with her symptoms.  She is here for Covid testing.  Past Medical History:  Diagnosis Date  . Arnold-Chiari malformation (HCC)   . Asthma   . Bronchitis   . Headache   . Pregnancy induced hypertension     Patient Active Problem List   Diagnosis Date Noted  . Pre-eclampsia 02/25/2018  . Attention deficit 02/25/2018  . 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 New daily persistent headache 02/25/2018  . Gestational hypertension without significant proteinuria in third trimester 11/23/2017  . Previous cesarean delivery affecting pregnancy 11/23/2017  . Smoking (tobacco) complicating pregnancy, unspecified trimester 05/25/2017  . Supervision of other normal pregnancy, antepartum 05/21/2017  . History of 2 cesarean sections 05/21/2017  . Heart palpitations 05/21/2017    Past Surgical History:  Procedure Laterality Date  . CESAREAN SECTION     breech and 2nd for RC/S  . CESAREAN SECTION N/A 11/23/2017   Procedure: REPEAT CESAREAN SECTION;  Surgeon: Marlow Baars, MD;  Location: [facility]IRTHING SUITES;  Service: Obstetrics;  Laterality: N/A;  . NOSE SURGERY      OB History    Gravida  5   Para  3   Term  3   Preterm      AB  2   Living  3     SAB  1   TAB  1   Ectopic      Multiple  0   Live Births  3            Home Medications    Prior to Admission medications   Medication Sig Start Date End Date Taking?  Authorizing Provider  albuterol (PROVENTIL) (2.5 MG/3ML) 0.083% nebulizer solution Take 3 mLs (2.5 mg total) by nebulization every 6 (six) hours as needed for wheezing or shortness of breath. 09/13/19   Eustace Moore, MD  albuterol (VENTOLIN HFA) 108 (90 Base) MCG/ACT inhaler Inhale 1-2 puffs into the lungs every 6 (six) hours as needed for wheezing or shortness of breath. 09/13/19   Eustace Moore, MD  busPIRone (BUSPAR) 5 MG tablet Take 5 mg by mouth 2 (two) times daily as needed. 05/19/19   [provider]  Multiple Vitamin (MULTIVITAMIN) tablet Take 1 tablet by mouth daily.    [provider]    Family History Family History  Problem Relation Age of Onset  . Hypertension Maternal Grandmother   . Healthy Sister   . Healthy Sister     Social History Social History   Tobacco Use  . Smoking status: Former Smoker    Packs/day: 0.50    Years: 5.00    Pack years: 2.50    Types: Cigarettes    Quit date: 03/14/2017    Years since quitting: 2.5  . Smokeless tobacco: Never Used  . Tobacco comment: states "need to"  Vaping Use  . Vaping Use:  Never used  Substance Use Topics  . Alcohol use: No  . Drug use: No     Allergies   Penicillins   Review of Systems Review of Systems See HPI Physical Exam Triage Vital Signs ED Triage Vitals  Enc Vitals Group     BP 09/13/19 0912 (!) 120/92     Pulse Rate 09/13/19 0912 70     Resp 09/13/19 0912 16     Temp 09/13/19 0912 99.2 F (37.3 C)     Temp Source 09/13/19 0912 Oral     SpO2 09/13/19 0912 100 %     Weight --      Height --      Head Circumference --      Peak Flow --      Pain Score 09/13/19 0908 6     Pain Loc --      Pain Edu? --      Excl. in GC? --    No data found.  Updated Vital Signs BP (!) 120/92 (BP Location: Left Arm)   Pulse 70   Temp 99.2 F (37.3 C) (Oral)   Resp 16   SpO2 100%   Breastfeeding No      Physical Exam Constitutional:      General: She is not in acute  distress.    Appearance: She is well-developed.  HENT:     Head: Normocephalic and atraumatic.     Nose:     Comments: Clear rhinorrhea    Mouth/Throat:     Comments: Mild posterior pharynx erythema Eyes:     Conjunctiva/sclera: Conjunctivae normal.     Pupils: Pupils are equal, round, and reactive to light.  Cardiovascular:     Rate and Rhythm: Normal rate and regular rhythm.     Heart sounds: Normal heart sounds.  Pulmonary:     Effort: Pulmonary effort is normal. No respiratory distress.     Comments: Lungs are clear Abdominal:     General: There is no distension.     Palpations: Abdomen is soft.  Musculoskeletal:        General: Normal range of motion.     Cervical back: Normal range of motion.  Skin:    General: Skin is warm and dry.  Neurological:     Mental Status: She is alert.  Psychiatric:        Behavior: Behavior normal.      UC Treatments / Results  Labs (all labs ordered are listed, but only abnormal results are displayed) Labs Reviewed  SARS CORONAVIRUS 2 (TAT 6-24 HRS)    EKG   Radiology No results found.  Procedures Procedures (including critical care time)  Medications Ordered in UC Medications - No data to display  Initial Impression / Assessment and Plan / UC Course  I have reviewed the triage vital signs and the nursing notes.  Pertinent labs & imaging results that were available during my care of the patient were reviewed by me and considered in my medical decision making (see chart for details).     Reviewed patient has a viral URI.  Possible Covid.  Needs to go home and quarantine.  Symptomatic care discussed Final Clinical Impressions(s) / UC Diagnoses   Final diagnoses:  Viral URI  Exposure to COVID-19 virus     Discharge Instructions     Go home to rest Drink plenty of fluids Take Tylenol for pain or fever You may take over-the-counter cough and cold medicines as needed You must quarantine  at home until your test  result is available You can check for your test result in MyChart    ED Prescriptions    Medication Sig Dispense Auth. Provider   albuterol (VENTOLIN HFA) 108 (90 Base) MCG/ACT inhaler Inhale 1-2 puffs into the lungs every 6 (six) hours as needed for wheezing or shortness of breath. 18 g Eustace Moore, MD   albuterol (PROVENTIL) (2.5 MG/3ML) 0.083% nebulizer solution Take 3 mLs (2.5 mg total) by nebulization every 6 (six) hours as needed for wheezing or shortness of breath. 75 mL Eustace Moore, MD     PDMP not reviewed this encounter.   Eustace Moore, MD 09/13/19 [phone]

## 2019-09-13 NOTE — ED Triage Notes (Signed)
Pt states she was around someone over the weekend who tested positive for covid. Pt states she began having sx last night of headache, body aches, chills, excessive sweating, nausea, and anxiety. Pt states she has taken her anxiety medication to help her with her anxiety.

## 2019-11-09 IMAGING — CR DG CHEST 2V
2 series · 2 of 2 positions shown · non-contrast
Comparison: 01/21/2012 chest radiograph.

CLINICAL DATA: Dyspnea. Postpartum with recent cesarean delivery.
Preeclampsia.

EXAM:
CHEST - 2 VIEW

[chest pa]
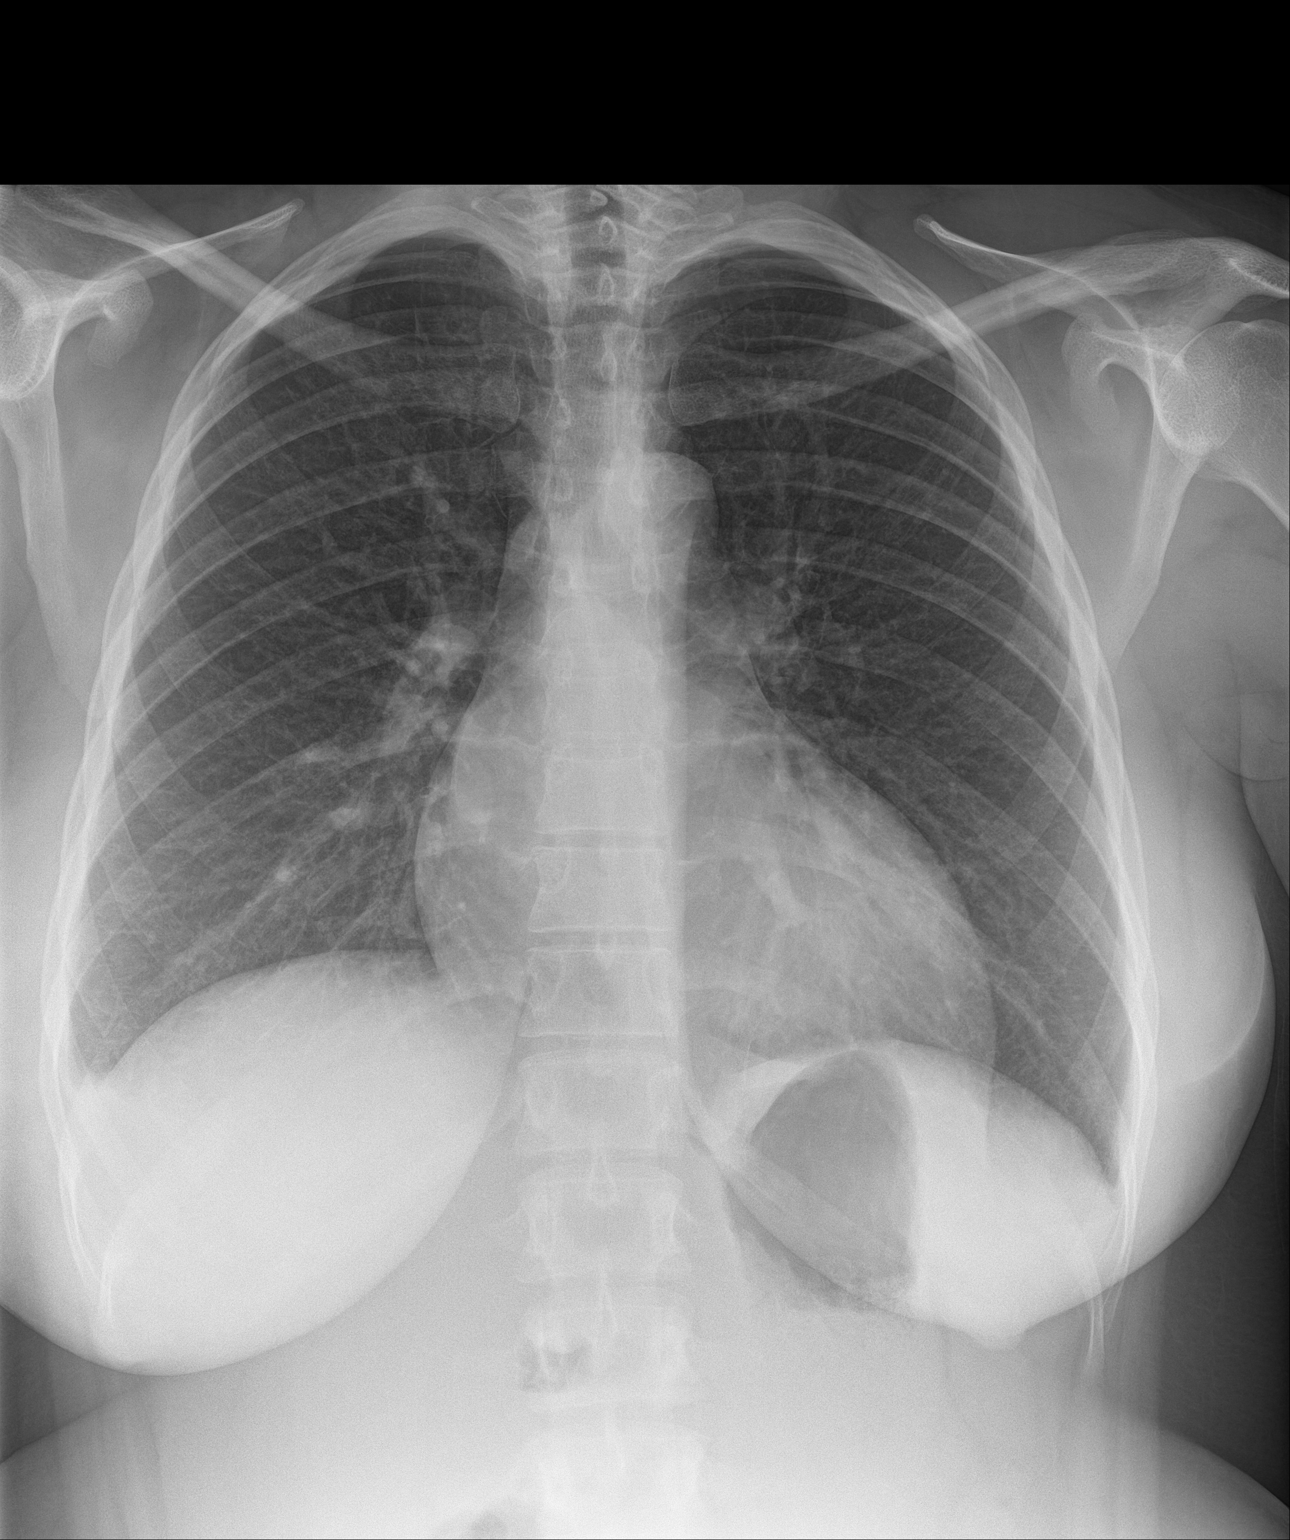

[chest lat]
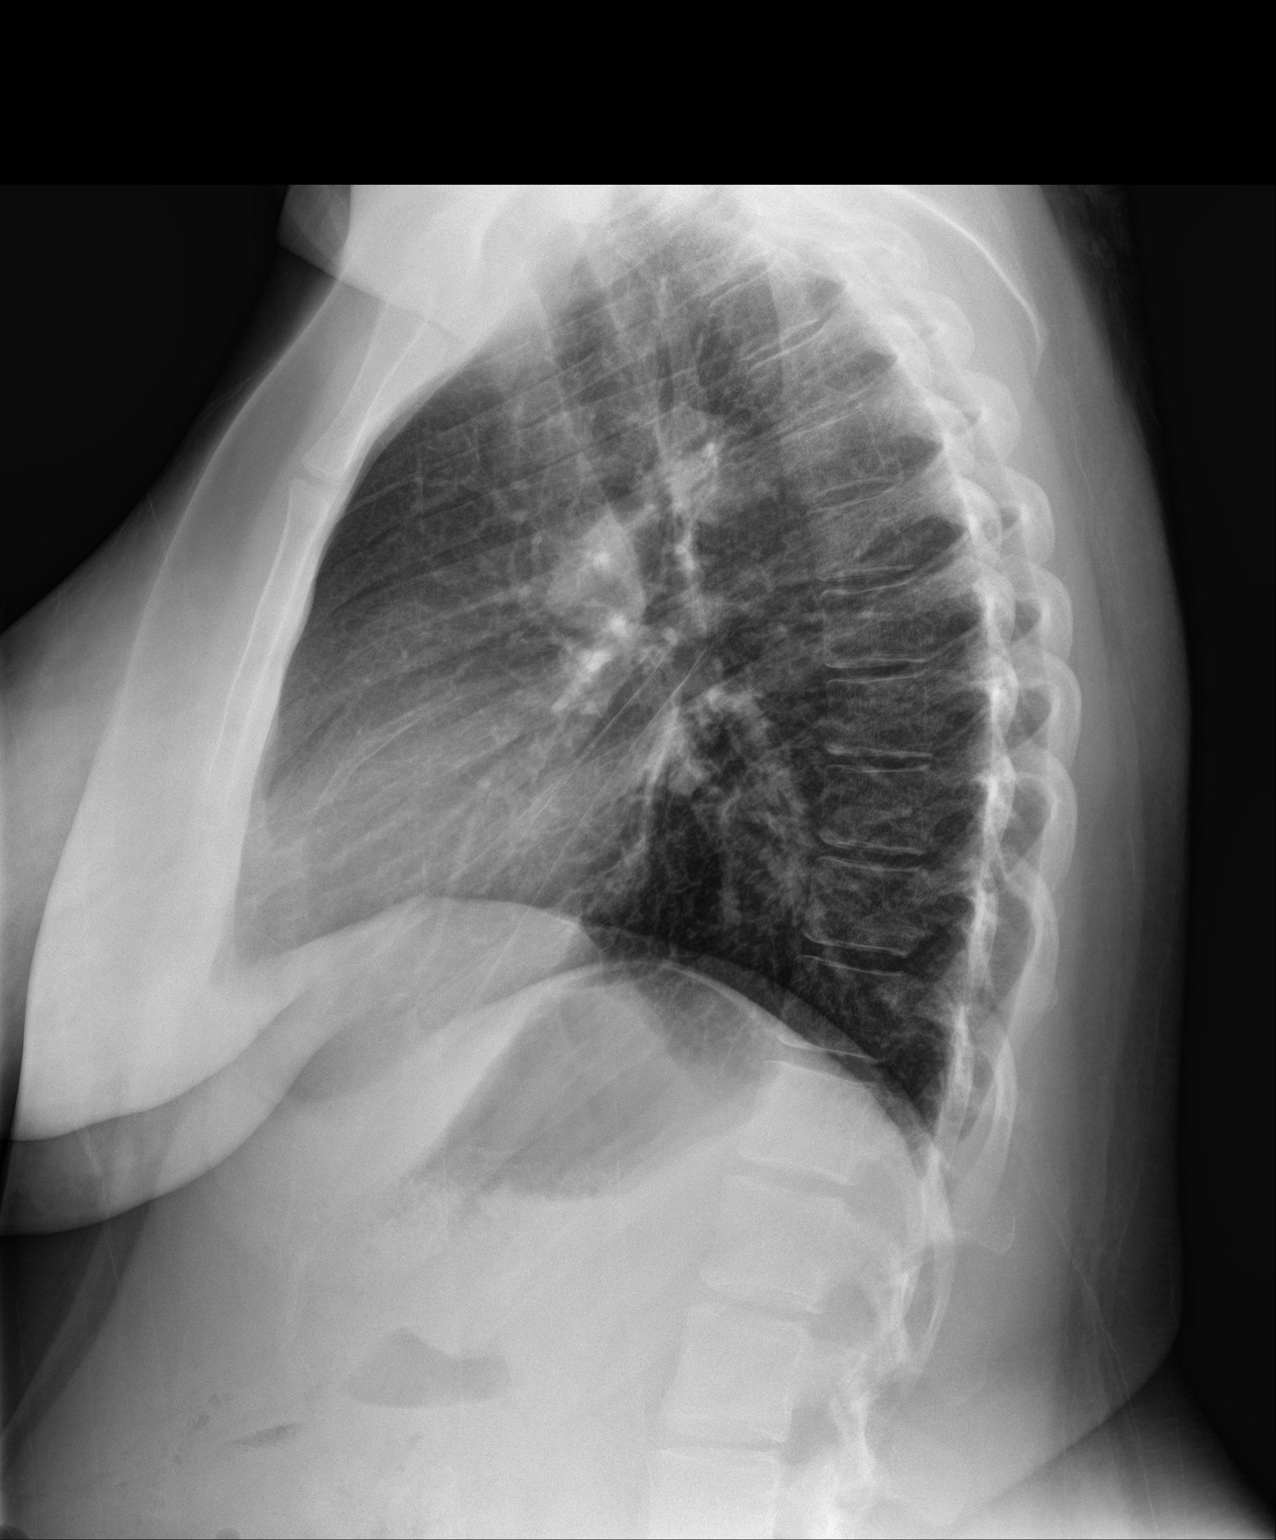

[2 of 2 positions shown; findings below may reference images not displayed]

FINDINGS: Stable cardiomediastinal silhouette with normal heart size. No
pneumothorax. Trace right pleural effusion. No left pleural
effusion. No pulmonary edema. No acute consolidative airspace
disease.
IMPRESSION: Trace right pleural effusion. Otherwise no active disease in the
chest.

## 2020-03-10 ENCOUNTER — Other Ambulatory Visit: Payer: Self-pay

## 2020-03-10 ENCOUNTER — Ambulatory Visit (HOSPITAL_COMMUNITY)
Admission: EM | Admit: 2020-03-10 | Discharge: 2020-03-10 | Disposition: A | Discharge status: Home / Self Care | Payer: Medicaid Other | Attending: Student | Admitting: Student

## 2020-03-10 ENCOUNTER — Encounter (HOSPITAL_COMMUNITY): Payer: Self-pay | Admitting: Emergency Medicine

## 2020-03-10 ENCOUNTER — Ambulatory Visit (INDEPENDENT_AMBULATORY_CARE_PROVIDER_SITE_OTHER): Payer: Medicaid Other

## 2020-03-10 DIAGNOSIS — S61411A Laceration without foreign body of right hand, initial encounter: Secondary | ICD-10-CM | POA: Diagnosis not present

## 2020-03-10 DIAGNOSIS — S6991XA Unspecified injury of right wrist, hand and finger(s), initial encounter: Secondary | ICD-10-CM

## 2020-03-10 DIAGNOSIS — S61214A Laceration without foreign body of right ring finger without damage to nail, initial encounter: Secondary | ICD-10-CM | POA: Diagnosis not present

## 2020-03-10 DIAGNOSIS — M25541 Pain in joints of right hand: Secondary | ICD-10-CM

## 2020-03-10 NOTE — ED Provider Notes (Signed)
[facility]    CSN: [id number] Arrival date & time: 03/10/20  1028      History   Chief Complaint Chief Complaint  Patient presents with  . Hand Injury    HPI Meagan Ross is a 30 y.o. female presenting with hand injury. History chiari malformation, asthma, bronchitis, headaches, gestational hypertension.  Presenting today for laceration she sustained 14 hours ago on right hand.  States that she cut her hand on a sharp silver object on the ground.  States that the area has stopped bleeding on its own but it is still very painful.  States that the knuckle under the area is also very painful particularly with movement.  Denies sensation changes. Denies other injury, fall. She is right handed.  HPI  Past Medical History:  Diagnosis Date  . Arnold-Chiari malformation (HCC)   . Asthma   . Bronchitis   . Headache   . Pregnancy induced hypertension     Patient Active Problem List   Diagnosis Date Noted  . Pre-eclampsia 02/25/2018  . Attention deficit 02/25/2018  . 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 New daily persistent headache 02/25/2018  . Gestational hypertension without significant proteinuria in third trimester 11/23/2017  . Previous cesarean delivery affecting pregnancy 11/23/2017  . Smoking (tobacco) complicating pregnancy, unspecified trimester 05/25/2017  . Supervision of other normal pregnancy, antepartum 05/21/2017  . History of 2 cesarean sections 05/21/2017  . Heart palpitations 05/21/2017    Past Surgical History:  Procedure Laterality Date  . CESAREAN SECTION     breech and 2nd for RC/S  . CESAREAN SECTION N/A 11/23/2017   Procedure: REPEAT CESAREAN SECTION;  Surgeon: Marlow Baars, MD;  Location: [facility];  Service: Obstetrics;  Laterality: N/A;  . NOSE SURGERY      OB History    Gravida  5   Para  3   Term  3   Preterm      AB  2   Living  3     SAB  1   IAB  1   Ectopic      Multiple  0   Live  Births  3            Home Medications    Prior to Admission medications   Medication Sig Start Date End Date Taking? Authorizing Provider  albuterol (PROVENTIL) (2.5 MG/3ML) 0.083% nebulizer solution Take 3 mLs (2.5 mg total) by nebulization every 6 (six) hours as needed for wheezing or shortness of breath. 09/13/19   Eustace Moore, MD  albuterol (VENTOLIN HFA) 108 (90 Base) MCG/ACT inhaler Inhale 1-2 puffs into the lungs every 6 (six) hours as needed for wheezing or shortness of breath. 09/13/19   Eustace Moore, MD  busPIRone (BUSPAR) 5 MG tablet Take 5 mg by mouth 2 (two) times daily as needed. 05/19/19   [provider]  Multiple Vitamin (MULTIVITAMIN) tablet Take 1 tablet by mouth daily.    [provider]    Family History Family History  Problem Relation Age of Onset  . Hypertension Maternal Grandmother   . Healthy Sister   . Healthy Sister     Social History Social History   Tobacco Use  . Smoking status: Former Smoker    Packs/day: 0.50    Years: 5.00    Pack years: 2.50    Types: Cigarettes    Quit date: 03/14/2017    Years since quitting: 2.9  . Smokeless tobacco: Never Used  .  Tobacco comment: states "need to"  Vaping Use  . Vaping Use: Never used  Substance Use Topics  . Alcohol use: No  . Drug use: No     Allergies   Penicillins   Review of Systems Review of Systems  Skin: Positive for wound.  All other systems reviewed and are negative.    Physical Exam Triage Vital Signs ED Triage Vitals  Enc Vitals Group     BP 03/10/20 1050 (!) 114/55     Pulse Rate 03/10/20 1050 81     Resp 03/10/20 1050 18     Temp 03/10/20 1050 98.3 F (36.8 C)     Temp Source 03/10/20 1050 Oral     SpO2 03/10/20 1050 100 %     Weight --      Height --      Head Circumference --      Peak Flow --      Pain Score 03/10/20 1049 0     Pain Loc --      Pain Edu? --      Excl. in [place]? --    No data found.  Updated Vital Signs BP (!)  114/55 (BP Location: Left Arm)   Pulse 81   Temp 98.3 F (36.8 C) (Oral)   Resp 18   SpO2 100%   Visual Acuity Right Eye Distance:   Left Eye Distance:   Bilateral Distance:    Right Eye Near:   Left Eye Near:    Bilateral Near:     Physical Exam Vitals reviewed.  Constitutional:      Appearance: Normal appearance.  Cardiovascular:     Rate and Rhythm: Normal rate and regular rhythm.     Heart sounds: Normal heart sounds.  Pulmonary:     Effort: Pulmonary effort is normal.     Breath sounds: Normal breath sounds.  Skin:    Comments: R dorsal middle knuckle- skin with 1cm laceration. Not actively bleeding, healing on its own. No surrounding erythema or discharge.3rd knuckle TTP. ROM intact but with pain. Neurovascularly intact. Cap refill <2 seconds. Grip strength 3/5.   Neurological:     General: No focal deficit present.     Mental Status: She is alert and oriented to person, place, and time.  Psychiatric:        Mood and Affect: Mood normal.        Behavior: Behavior normal.        Thought Content: Thought content normal.        Judgment: Judgment normal.      UC Treatments / Results  Labs (all labs ordered are listed, but only abnormal results are displayed) Labs Reviewed - No data to display  EKG   Radiology DG Hand Complete Right  Result Date: 03/10/2020 CLINICAL DATA:  Right third finger pain with movement after fall last night with swelling and laceration. EXAM: RIGHT HAND - COMPLETE 3+ VIEW COMPARISON:  None. FINDINGS: There is no evidence of fracture or dislocation. There is no evidence of arthropathy or other focal bone abnormality. Soft tissues are unremarkable. No radiopaque foreign body. IMPRESSION: No fracture or malalignment.  No radiopaque foreign bodies. Electronically Signed   By: Delbert Phenix M.D.   On: 03/10/2020 11:35    Procedures Procedures (including critical care time)  Medications Ordered in UC Medications - No data to  display  Initial Impression / Assessment and Plan / UC Course  I have reviewed the triage vital signs and the nursing  notes.  Pertinent labs & imaging results that were available during my care of the patient were reviewed by me and considered in my medical decision making (see chart for details).     Patient presenting with laceration R hand sustained 14 hours ago. Discussed risks and benefits of sutures vs dermabond. Wound is healing well on its own and is no longer actively bleeding; I am hesitant to retraumatize tissue in order to suture this. Patient is in agreement and elects dermabond. We discussed that she will likely have a small scar over R knuckle; patient verbalizes understanding and agreement. dermabond applied.   Tdap UTD per pt; she declines additional Tdap today.   Xray R hand negative.   Follow-up with hand specialist if symptoms worsen or persist; strict return precautions given. Information provided.   Spent over 40 minutes obtaining H&P, performing physical, interpreting xray, providing wound care and dermabond, discussing results, treatment plan and plan for follow-up with patient. Patient agrees with plan.   This chart was dictated using voice recognition software, Dragon. Despite the best efforts of this provider to proofread and correct errors, errors may still occur which can change documentation meaning.  Final Clinical Impressions(s) / UC Diagnoses   Final diagnoses:  Laceration of right hand without foreign body, initial encounter  Injury of right hand, initial encounter     Discharge Instructions     -When you get home, you can apply bandage over your laceration if desired.  -Wound care:  1. Keep the wound clean and dry for the next 24 hours. Leave your bandage on for this time.  2. After 24 hours, clean the wound 1-2x a day. ? Wash the wound with soap and water. ? Rinse the wound with water to remove all soap. ? Pat the wound dry with a clean towel.  Do not rub the wound.       3. Reapply bandage as needed -Come back and see [place] if you develop fevers/chills, discharge or redness around wound, worsening of pain despite treatment, etc -If your hand is still as painful as it is today, particularly with movement, please call hand specialist in 2-3 days to schedule follow-up appointment with them. If you develop new sensation changes in the fingers, call hand specialist on next business day to schedule appointment. Information below.     ED Prescriptions    None     PDMP not reviewed this encounter.   Rhys Martini, PA-C 03/10/20 1215

## 2020-03-10 NOTE — ED Triage Notes (Signed)
Pt states that she hit her hand on a sharp object outside last night and has laceration on her right hand. Pt states she doesn't feel pain and denies any numbness.

## 2020-03-10 NOTE — Discharge Instructions (Addendum)
-  When you get home, you can apply bandage over your laceration if desired.  -Wound care:  Keep the wound clean and dry for the next 24 hours. Leave your bandage on for this time.  After 24 hours, clean the wound 1-2x a day. Wash the wound with soap and water. Rinse the wound with water to remove all soap. Pat the wound dry with a clean towel. Do not rub the wound.       3. Reapply bandage as needed -Come back and see [place] if you develop fevers/chills, discharge or redness around wound, worsening of pain despite treatment, etc -If your hand is still as painful as it is today, particularly with movement, please call hand specialist in 2-3 days to schedule follow-up appointment with them. If you develop new sensation changes in the fingers, call hand specialist on next business day to schedule appointment. Information below.

## 2021-03-11 ENCOUNTER — Ambulatory Visit (HOSPITAL_COMMUNITY)
Admission: EM | Admit: 2021-03-11 | Discharge: 2021-03-11 | Disposition: A | Discharge status: Home / Self Care | Payer: Medicaid Other | Attending: Emergency Medicine | Admitting: Emergency Medicine

## 2021-03-11 ENCOUNTER — Other Ambulatory Visit: Payer: Self-pay

## 2021-03-11 ENCOUNTER — Encounter (HOSPITAL_COMMUNITY): Payer: Self-pay

## 2021-03-11 ENCOUNTER — Ambulatory Visit (INDEPENDENT_AMBULATORY_CARE_PROVIDER_SITE_OTHER): Payer: Medicaid Other

## 2021-03-11 DIAGNOSIS — R062 Wheezing: Secondary | ICD-10-CM | POA: Diagnosis not present

## 2021-03-11 DIAGNOSIS — J4521 Mild intermittent asthma with (acute) exacerbation: Secondary | ICD-10-CM | POA: Diagnosis not present

## 2021-03-11 MED ORDER — IPRATROPIUM-ALBUTEROL 20-100 MCG/ACT IN AERS: 1.0000 | INHALATION_SPRAY | Freq: Four times a day (QID) | RESPIRATORY_TRACT | 1 refills | Status: AC | PRN

## 2021-03-11 MED ORDER — PREDNISONE 20 MG PO TABS: 40.0000 mg | ORAL_TABLET | Freq: Every day | ORAL | 0 refills | Status: DC

## 2021-03-11 NOTE — ED Triage Notes (Signed)
Pt presents to the office for coughing x 2-3 months.  She thinks her asthma is making her cough.

## 2021-03-11 NOTE — Discharge Instructions (Signed)
For your wheezing and chest heaviness  Begin use of prednisone tomorrow morning with food for 5 days  I have changed your inhaler to a Combivent which has extra medicine in it to help with your chest heaviness and shortness of breath, you may use the Combivent inhaler taking 1 puff every 6 hours as needed  Begin use of a daily antihistamine such as Claritin or Zyrtec to help reduce any secretions that may be contributing to symptoms  Have a good night if symptoms continue to persist or worsen please follow-up with your primary care doctor for further evaluation of your asthma

## 2021-03-11 NOTE — ED Provider Notes (Signed)
[place]    CSN: [id number] Arrival date & time: 03/11/21  1350      History   Chief Complaint Chief Complaint  Patient presents with   Cough   Asthma    HPI Dazariah Natter is a 31 y.o. female.   You as well patient presents with nonproductive coughing, increased shortness of breath and intermittent wheezing the last 2 months.  Symptoms are worse at nighttime.  Endorses having to use her albuterol inhaler approximately 6 times a day.  Symptoms initially started after upper respiratory infection.  Tolerating food and liquids.  History of asthma, bronchitis, coronary malformation, palpitations.  Denies chest pain or tightness, fever, chills.  Past Medical History:  Diagnosis Date   Arnold-Chiari malformation ([place])    Asthma    Bronchitis    Headache    Pregnancy induced hypertension     Patient Active Problem List   Diagnosis Date Noted   Pre-eclampsia 02/25/2018   Attention deficit 02/25/2018   Chiari I malformation ([place]) 02/25/2018   New daily persistent headache 02/25/2018   Gestational hypertension without significant proteinuria in third trimester 11/23/2017   Previous cesarean delivery affecting pregnancy 11/23/2017   Smoking (tobacco) complicating pregnancy, unspecified trimester 05/25/2017   Supervision of other normal pregnancy, antepartum 05/21/2017   History of 2 cesarean sections 05/21/2017   Heart palpitations 05/21/2017    Past Surgical History:  Procedure Laterality Date   CESAREAN SECTION     breech and 2nd for RC/S   CESAREAN SECTION N/A 11/23/2017   Procedure: REPEAT CESAREAN SECTION;  Surgeon: Jerelyn Charles, MD;  Location: [place];  Service: Obstetrics;  Laterality: N/A;   NOSE SURGERY      OB History     Gravida  5   Para  3   Term  3   Preterm      AB  2   Living  3      SAB  1   IAB  1   Ectopic      Multiple  0   Live Births  3            Home Medications    Prior to Admission  medications   Medication Sig Start Date End Date Taking? Authorizing Provider  albuterol (PROVENTIL) (2.5 MG/3ML) 0.083% nebulizer solution Take 3 mLs (2.5 mg total) by nebulization every 6 (six) hours as needed for wheezing or shortness of breath. 09/13/19   Raylene Everts, MD  albuterol (VENTOLIN HFA) 108 (90 Base) MCG/ACT inhaler Inhale 1-2 puffs into the lungs every 6 (six) hours as needed for wheezing or shortness of breath. 09/13/19   Raylene Everts, MD  busPIRone (BUSPAR) 5 MG tablet Take 5 mg by mouth 2 (two) times daily as needed. 05/19/19   [provider]  Multiple Vitamin (MULTIVITAMIN) tablet Take 1 tablet by mouth daily.    [provider]    Family History Family History  Problem Relation Age of Onset   Hypertension Maternal Grandmother    Healthy Sister    Healthy Sister     Social History Social History   Tobacco Use   Smoking status: Former    Packs/day: 0.50    Years: 5.00    Pack years: 2.50    Types: Cigarettes    Quit date: 03/14/2017    Years since quitting: 3.9   Smokeless tobacco: Never   Tobacco comments:    states "need to"  Vaping Use   Vaping Use:  Never used  Substance Use Topics   Alcohol use: No   Drug use: No     Allergies   Penicillins   Review of Systems Review of Systems  Constitutional: Negative.   HENT: Negative.    Respiratory:  Positive for cough, shortness of breath and wheezing. Negative for apnea, choking, chest tightness and stridor.   Cardiovascular: Negative.   Skin: Negative.   Neurological: Negative.     Physical Exam Triage Vital Signs ED Triage Vitals  Enc Vitals Group     BP 03/11/21 1516 119/80     Pulse Rate 03/11/21 1516 87     Resp 03/11/21 1516 16     Temp 03/11/21 1516 98.7 F (37.1 C)     Temp Source 03/11/21 1516 Oral     SpO2 03/11/21 1516 97 %     Weight --      Height --      Head Circumference --      Peak Flow --      Pain Score 03/11/21 1518 0     Pain Loc --       Pain Edu? --      Excl. in [place]? --    No data found.  Updated Vital Signs BP 119/80 (BP Location: Left Arm)    Pulse 87    Temp 98.7 F (37.1 C) (Oral)    Resp 16    SpO2 97%   Visual Acuity Right Eye Distance:   Left Eye Distance:   Bilateral Distance:    Right Eye Near:   Left Eye Near:    Bilateral Near:     Physical Exam Constitutional:      Appearance: Normal appearance.  HENT:     Head: Normocephalic.  Eyes:     Extraocular Movements: Extraocular movements intact.  Cardiovascular:     Rate and Rhythm: Normal rate and regular rhythm.     Pulses: Normal pulses.     Heart sounds: Normal heart sounds.  Pulmonary:     Effort: Pulmonary effort is normal.     Breath sounds: Wheezing present.  Skin:    General: Skin is warm and dry.  Neurological:     Mental Status: She is alert and oriented to person, place, and time. Mental status is at baseline.  Psychiatric:        Mood and Affect: Mood normal.        Behavior: Behavior normal.     UC Treatments / Results  Labs (all labs ordered are listed, but only abnormal results are displayed) Labs Reviewed - No data to display  EKG   Radiology No results found.  Procedures Procedures (including critical care time)  Medications Ordered in UC Medications - No data to display  Initial Impression / Assessment and Plan / UC Course  I have reviewed the triage vital signs and the nursing notes.  Pertinent labs & imaging results that were available during my care of the patient were reviewed by me and considered in my medical decision making (see chart for details).  Using Mild intermittent asthma exacerbation  Vital signs are stable, O2 saturation 97% on room air, wheezing heard to auscultation, discussed findings with patient, chest x-ray negative, discussed with patient, will move forward with treatment of symptoms, prednisone 40 mg daily for 5 days prescribed, switch albuterol inhaler to a complement to be used  every 6 hours as needed, recommended follow-up with PCP if symptoms continue to persist for pulmonary function testing and  further evaluation of asthma, may follow-up with urgent care as needed Final Clinical Impressions(s) / UC Diagnoses   Final diagnoses:  None   Discharge Instructions   None    ED Prescriptions   None    PDMP not reviewed this encounter.   Hans Eden, [place] 03/12/21 1111

## 2021-04-17 ENCOUNTER — Ambulatory Visit (HOSPITAL_COMMUNITY)
Admission: EM | Admit: 2021-04-17 | Discharge: 2021-04-17 | Disposition: A | Discharge status: Home / Self Care | Payer: Medicaid Other | Attending: Nurse Practitioner | Admitting: Nurse Practitioner

## 2021-04-17 ENCOUNTER — Other Ambulatory Visit: Payer: Self-pay

## 2021-04-17 ENCOUNTER — Encounter (HOSPITAL_COMMUNITY): Payer: Self-pay

## 2021-04-17 DIAGNOSIS — J45901 Unspecified asthma with (acute) exacerbation: Secondary | ICD-10-CM

## 2021-04-17 DIAGNOSIS — R062 Wheezing: Secondary | ICD-10-CM

## 2021-04-17 MED ORDER — ALBUTEROL SULFATE HFA 108 (90 BASE) MCG/ACT IN AERS
2.0000 | INHALATION_SPRAY | Freq: Four times a day (QID) | RESPIRATORY_TRACT | 1 refills | Status: AC | PRN
Start: 2021-04-17 — End: ?

## 2021-04-17 MED ORDER — ALBUTEROL SULFATE (2.5 MG/3ML) 0.083% IN NEBU
2.5000 mg | INHALATION_SOLUTION | RESPIRATORY_TRACT | Status: AC
  Administered 2021-04-17: 2.5 mg via RESPIRATORY_TRACT

## 2021-04-17 MED ORDER — MONTELUKAST SODIUM 10 MG PO TABS: 10.0000 mg | ORAL_TABLET | Freq: Every day | ORAL | 1 refills | Status: AC

## 2021-04-17 MED ORDER — ALBUTEROL SULFATE (2.5 MG/3ML) 0.083% IN NEBU
2.5000 mg | INHALATION_SOLUTION | Freq: Four times a day (QID) | RESPIRATORY_TRACT | 1 refills | Status: AC | PRN
Start: 2021-04-17 — End: ?

## 2021-04-17 MED ORDER — PREDNISONE 20 MG PO TABS: 40.0000 mg | ORAL_TABLET | Freq: Every day | ORAL | 0 refills | Status: AC

## 2021-04-17 MED ORDER — ALBUTEROL SULFATE (2.5 MG/3ML) 0.083% IN NEBU
INHALATION_SOLUTION | RESPIRATORY_TRACT | Status: AC
  Filled 2021-04-17: qty 3

## 2021-04-17 NOTE — ED Triage Notes (Signed)
Pt presents today with difficulty breathing, Pt states she has asthma. Pt states chest feels tight. Pt reports SOB when walking.  ?

## 2021-04-17 NOTE — Discharge Instructions (Addendum)
Take medication as prescribed.  I have also prescribed Singulair, which will help control your asthma and allergy symptoms. ?Recommend using a humidifier in your home, and during sleep. ?It is important that you follow-up with your primary care physician for further testing or referral to a pulmonologist to help control your asthma symptoms. ?Follow-up if symptoms worsen or do not improve. ? ?

## 2021-04-17 NOTE — ED Provider Notes (Addendum)
[facility]    CSN: [id number] Arrival date & time: 04/17/21  4132      History   Chief Complaint Chief Complaint  Patient presents with   Shortness of Breath    HPI Meagan Ross is a 31 y.o. female.   The patient is a 31 year old female who presents for wheezing, shortness of breath.  Patient states her symptoms have been present for over a month.  She was seen last here on 03/11/2021 and states her symptoms never improved since that time.  She was given prednisone, and ipratropium/albuterol inhaler.  She states that she did not notice any change in her symptoms, and that she does better on regular albuterol.  Patient states she has been using the inhaler at least 4-5 times per day.  She also complains of chest tightness.  The patient denies fever, chills, difficulty breathing, cough, chest pain, or or GI symptoms.  She does state that she has dogs that she was "allergic" to, but she no longer is.  States that when she started having symptoms with her allergies to her dog, she did have shortness of breath, wheezing, but also had nasal symptoms as well.  States that she does not feel she is any longer allergic to her animals.  States they are inside most of the time.  States that she has not seen her PCP in quite some time.   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   New daily persistent headache 02/25/2018   Gestational  hypertension without significant proteinuria in third trimester 11/23/2017   Previous cesarean delivery affecting pregnancy 11/23/2017   Smoking (tobacco) complicating pregnancy, unspecified trimester 05/25/2017   Supervision of other normal pregnancy, antepartum 05/21/2017   History of 2 cesarean sections 05/21/2017   Heart palpitations 05/21/2017    Past Surgical History:  Procedure Laterality Date   CESAREAN SECTION     breech and 2nd for RC/S   CESAREAN SECTION N/A 11/23/2017   Procedure: REPEAT CESAREAN SECTION;  Surgeon: Marlow Baars, MD;  Location: [facility];  Service: Obstetrics;  Laterality: N/A;   NOSE SURGERY      OB History     Gravida  5   Para  3   Term  3   Preterm      AB  2   Living  3      SAB  1   IAB  1   Ectopic      Multiple  0   Live Births  3            Home Medications    Prior to Admission medications   Medication Sig Start Date End Date Taking? Authorizing Provider  albuterol (PROVENTIL) (2.5 MG/3ML) 0.083% nebulizer solution Take 3 mLs (2.5 mg total) by nebulization every 6 (six) hours as needed for wheezing or shortness of breath. 09/13/19   Eustace Moore, MD  albuterol (VENTOLIN HFA) 108 (  90 Base) MCG/ACT inhaler Inhale 1-2 puffs into the lungs every 6 (six) hours as needed for wheezing or shortness of breath. 09/13/19   Eustace Moore, MD  busPIRone (BUSPAR) 5 MG tablet Take 5 mg by mouth 2 (two) times daily as needed. 05/19/19   [provider]  Ipratropium-Albuterol (COMBIVENT) 20-100 MCG/ACT AERS respimat Inhale 1 puff into the lungs every 6 (six) hours as needed for wheezing. 03/11/21   Valinda Hoar, NP  Multiple Vitamin (MULTIVITAMIN) tablet Take 1 tablet by mouth daily.    [provider]  predniSONE (DELTASONE) 20 MG tablet Take 2 tablets (40 mg total) by mouth daily. 03/11/21   Valinda Hoar, NP    Family History Family History  Problem Relation Age of Onset   Hypertension  Maternal Grandmother    Healthy Sister    Healthy Sister     Social History Social History   Tobacco Use   Smoking status: Former    Packs/day: 0.50    Years: 5.00    Pack years: 2.50    Types: Cigarettes    Quit date: 03/14/2017    Years since quitting: 4.0   Smokeless tobacco: Never   Tobacco comments:    states "need to"  Vaping Use   Vaping Use: Never used  Substance Use Topics   Alcohol use: No   Drug use: No     Allergies   Penicillins   Review of Systems Review of Systems  Constitutional: Negative.  Negative for fever.  HENT:  Negative for congestion, postnasal drip, sinus pressure, sinus pain, sneezing and sore throat.   Eyes: Negative.   Respiratory:  Positive for shortness of breath and wheezing. Negative for cough and chest tightness.   Cardiovascular: Negative.  Negative for chest pain.  Gastrointestinal: Negative.   Musculoskeletal: Negative.   Psychiatric/Behavioral: Negative.      Physical Exam Triage Vital Signs ED Triage Vitals  Enc Vitals Group     BP 04/17/21 0939 102/73     Pulse Rate 04/17/21 0932 75     Resp 04/17/21 0932 20     Temp 04/17/21 0932 98.7 F (37.1 C)     Temp src --      SpO2 04/17/21 0932 95 %     Weight --      Height --      Head Circumference --      Peak Flow --      Pain Score 04/17/21 0932 0     Pain Loc --      Pain Edu? --      Excl. in GC? --    No data found.  Updated Vital Signs BP 102/73 (BP Location: Left Arm)   Pulse 75   Temp 98.7 F (37.1 C)   Resp 20   SpO2 95%   Visual Acuity Right Eye Distance:   Left Eye Distance:   Bilateral Distance:    Right Eye Near:   Left Eye Near:    Bilateral Near:     Physical Exam Vitals reviewed.  Constitutional:      General: She is not in acute distress.    Appearance: She is well-developed.  HENT:     Head: Normocephalic and atraumatic.     Mouth/Throat:     Mouth: Mucous membranes are moist.  Eyes:     Extraocular Movements: Extraocular  movements intact.     Pupils: Pupils are equal, round, and reactive to light.  Cardiovascular:  Rate and Rhythm: Normal rate and regular rhythm.  Pulmonary:     Effort: Pulmonary effort is normal.     Breath sounds: Examination of the right-upper field reveals wheezing. Examination of the left-upper field reveals wheezing. Examination of the right-lower field reveals decreased breath sounds. Examination of the left-lower field reveals decreased breath sounds. Decreased breath sounds and wheezing present. No rhonchi or rales.     Comments: Post nebulizer treatment, patient with normal breath sounds throughout. Increased air movement. No wheezing noted.  Abdominal:     General: Bowel sounds are normal.     Tenderness: There is no abdominal tenderness.  Musculoskeletal:     Cervical back: Normal range of motion.  Skin:    General: Skin is warm and dry.  Neurological:     General: No focal deficit present.     Mental Status: She is alert and oriented to person, place, and time.  Psychiatric:        Mood and Affect: Mood normal.        Behavior: Behavior normal.     UC Treatments / Results  Labs (all labs ordered are listed, but only abnormal results are displayed) Labs Reviewed - No data to display  EKG   Radiology No results found.  Procedures Procedures (including critical care time)  Medications Ordered in UC Medications  albuterol (PROVENTIL) (2.5 MG/3ML) 0.083% nebulizer solution 2.5 mg (2.5 mg Nebulization Given 04/17/21 0956)    Initial Impression / Assessment and Plan / UC Course  I have reviewed the triage vital signs and the nursing notes.  Pertinent labs & imaging results that were available during my care of the patient were reviewed by me and considered in my medical decision making (see chart for details).  The patient is a 31 year old female who continues to have asthma symptoms.  Symptoms have been present for over 1 month.  She was seen in our office on  2/13 with the same or similar symptoms.  She states at that time she was giving a different kind of inhaler, but feels that she does better with regular albuterol.  Today the patient continues to complain of wheezing, shortness of breath, and chest tightness.  Vital signs are stable today, her O2 sat is 95%, she is speaking in complete sentences without difficulty.  Albuterol nebulizer was provided in the office. Patient with good relief post nebulizer treatment.  We will provide the patient with a nebulizer machine for her to have at home.  Lengthy discussion to the patient regarding the importance of following up with her PCP for further testing and for consideration of a long-acting inhaler if her symptoms are not controlled at this time.  We will also prescribe a steroid for the next 5 days to help with her symptoms in addition to an albuterol nebulizer, and albuterol inhaler, and Singulair to help with her asthma symptoms.  Patient encouraged to follow-up with her PCP as she will need additional testing to determine the best medication to control her asthma.  Follow-up in our clinic as needed. Final Clinical Impressions(s) / UC Diagnoses   Final diagnoses:  None   Discharge Instructions   None    ED Prescriptions   None    PDMP not reviewed this encounter.   Abran Cantor, NP 04/17/21 1017    Leath-Warren, Sadie Haber, NP 04/17/21 1021

## 2022-02-17 IMAGING — DX DG HAND COMPLETE 3+V*R*
3 series · 3 of 3 positions shown · non-contrast
Comparison: None.

CLINICAL DATA: Right third finger pain with movement after fall
last night with swelling and laceration.

EXAM:
RIGHT HAND - COMPLETE 3+ VIEW

[hand pa]
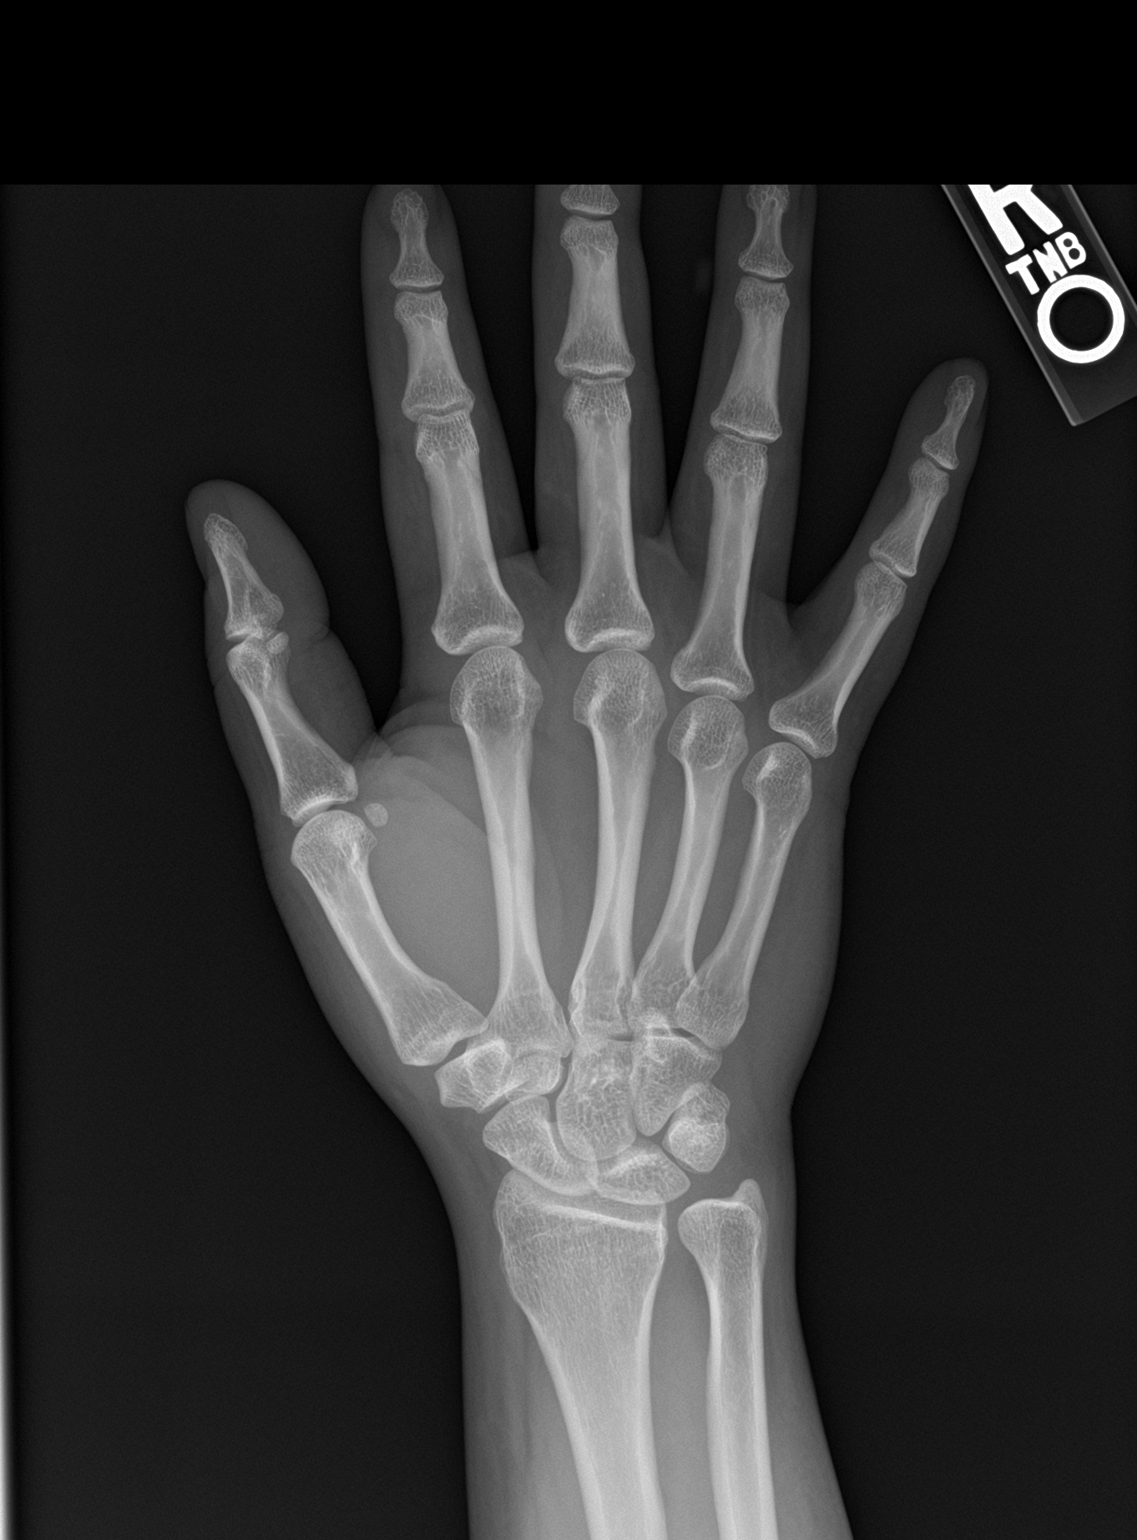

[hand obl]
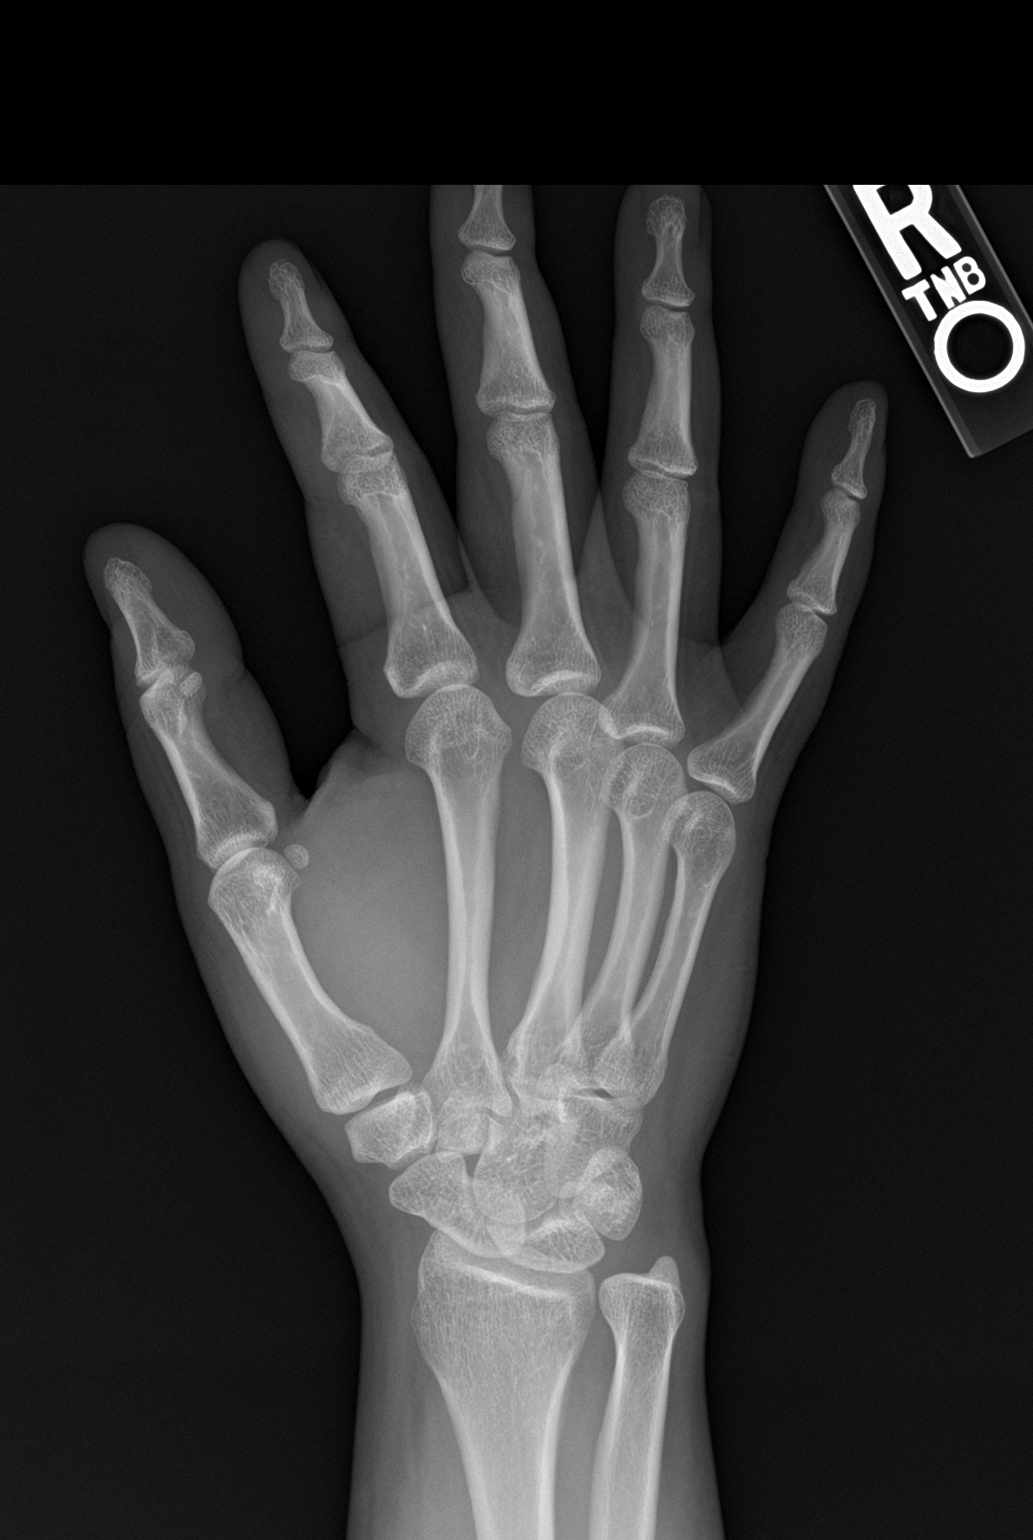

[hand lat]
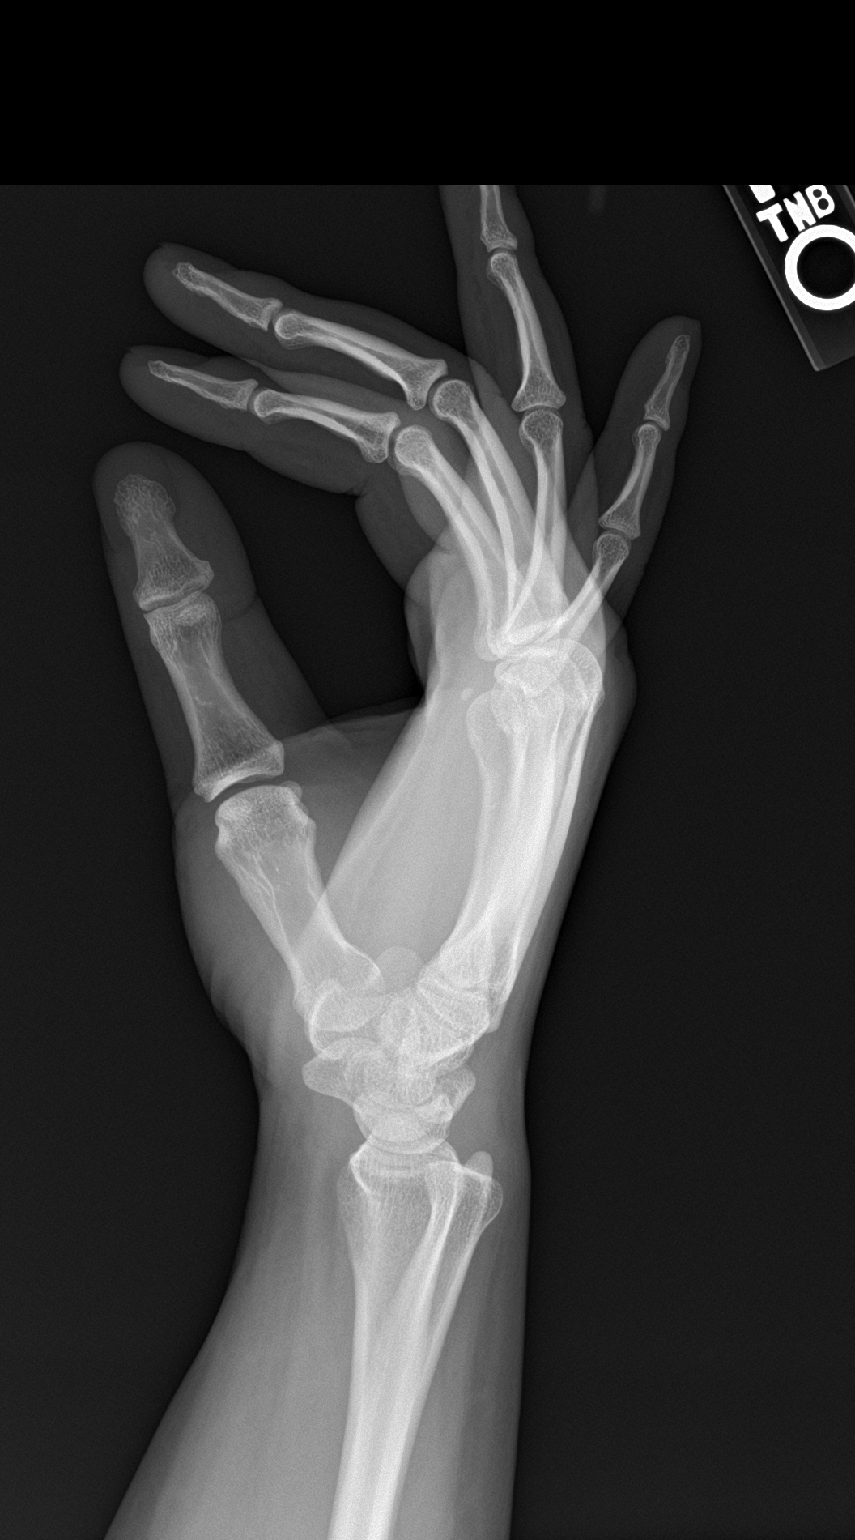

[3 of 3 positions shown; findings below may reference images not displayed]

FINDINGS: There is no evidence of fracture or dislocation. There is no
evidence of arthropathy or other focal bone abnormality. Soft
tissues are unremarkable. No radiopaque foreign body.
IMPRESSION: No fracture or malalignment.  No radiopaque foreign bodies.
